# Patient Record
Sex: Male | Born: 1965 | Race: White | Hispanic: No | Marital: Married | Smoking: Never smoker
Health system: Southern US, Community
[De-identification: ages and names within clinical notes are randomized; demographics above are authoritative.]

## PROBLEM LIST (undated history)

## (undated) DIAGNOSIS — I10 Essential (primary) hypertension: Secondary | ICD-10-CM

## (undated) HISTORY — PX: MOUTH SURGERY: SHX715

---

## 2008-07-07 ENCOUNTER — Emergency Department (HOSPITAL_COMMUNITY): Admission: EM | Admit: 2008-07-07 | Discharge: 2008-07-07 | Payer: Self-pay | Admitting: Family Medicine

## 2011-07-19 ENCOUNTER — Other Ambulatory Visit: Payer: Self-pay | Admitting: Occupational Medicine

## 2011-07-19 ENCOUNTER — Ambulatory Visit
Admission: RE | Admit: 2011-07-19 | Discharge: 2011-07-19 | Disposition: A | Discharge status: Home / Self Care | Payer: Self-pay | Source: Ambulatory Visit | Attending: Occupational Medicine | Admitting: Occupational Medicine

## 2011-07-19 DIAGNOSIS — T1490XA Injury, unspecified, initial encounter: Secondary | ICD-10-CM

## 2011-09-17 ENCOUNTER — Other Ambulatory Visit: Payer: Self-pay | Admitting: Orthopedic Surgery

## 2011-09-17 ENCOUNTER — Ambulatory Visit
Admission: RE | Admit: 2011-09-17 | Discharge: 2011-09-17 | Disposition: A | Discharge status: Home / Self Care | Payer: PRIVATE HEALTH INSURANCE | Source: Ambulatory Visit | Attending: Orthopedic Surgery | Admitting: Orthopedic Surgery

## 2011-09-17 DIAGNOSIS — S6990XA Unspecified injury of unspecified wrist, hand and finger(s), initial encounter: Secondary | ICD-10-CM

## 2013-11-05 IMAGING — CR DG FINGER INDEX 2+V*L*
1 series · 1 of 1 positions shown · non-contrast
Comparison: None.

CLINICAL DATA: Left finger injury with pain.

LEFT INDEX FINGER 2+V

[view not recorded]
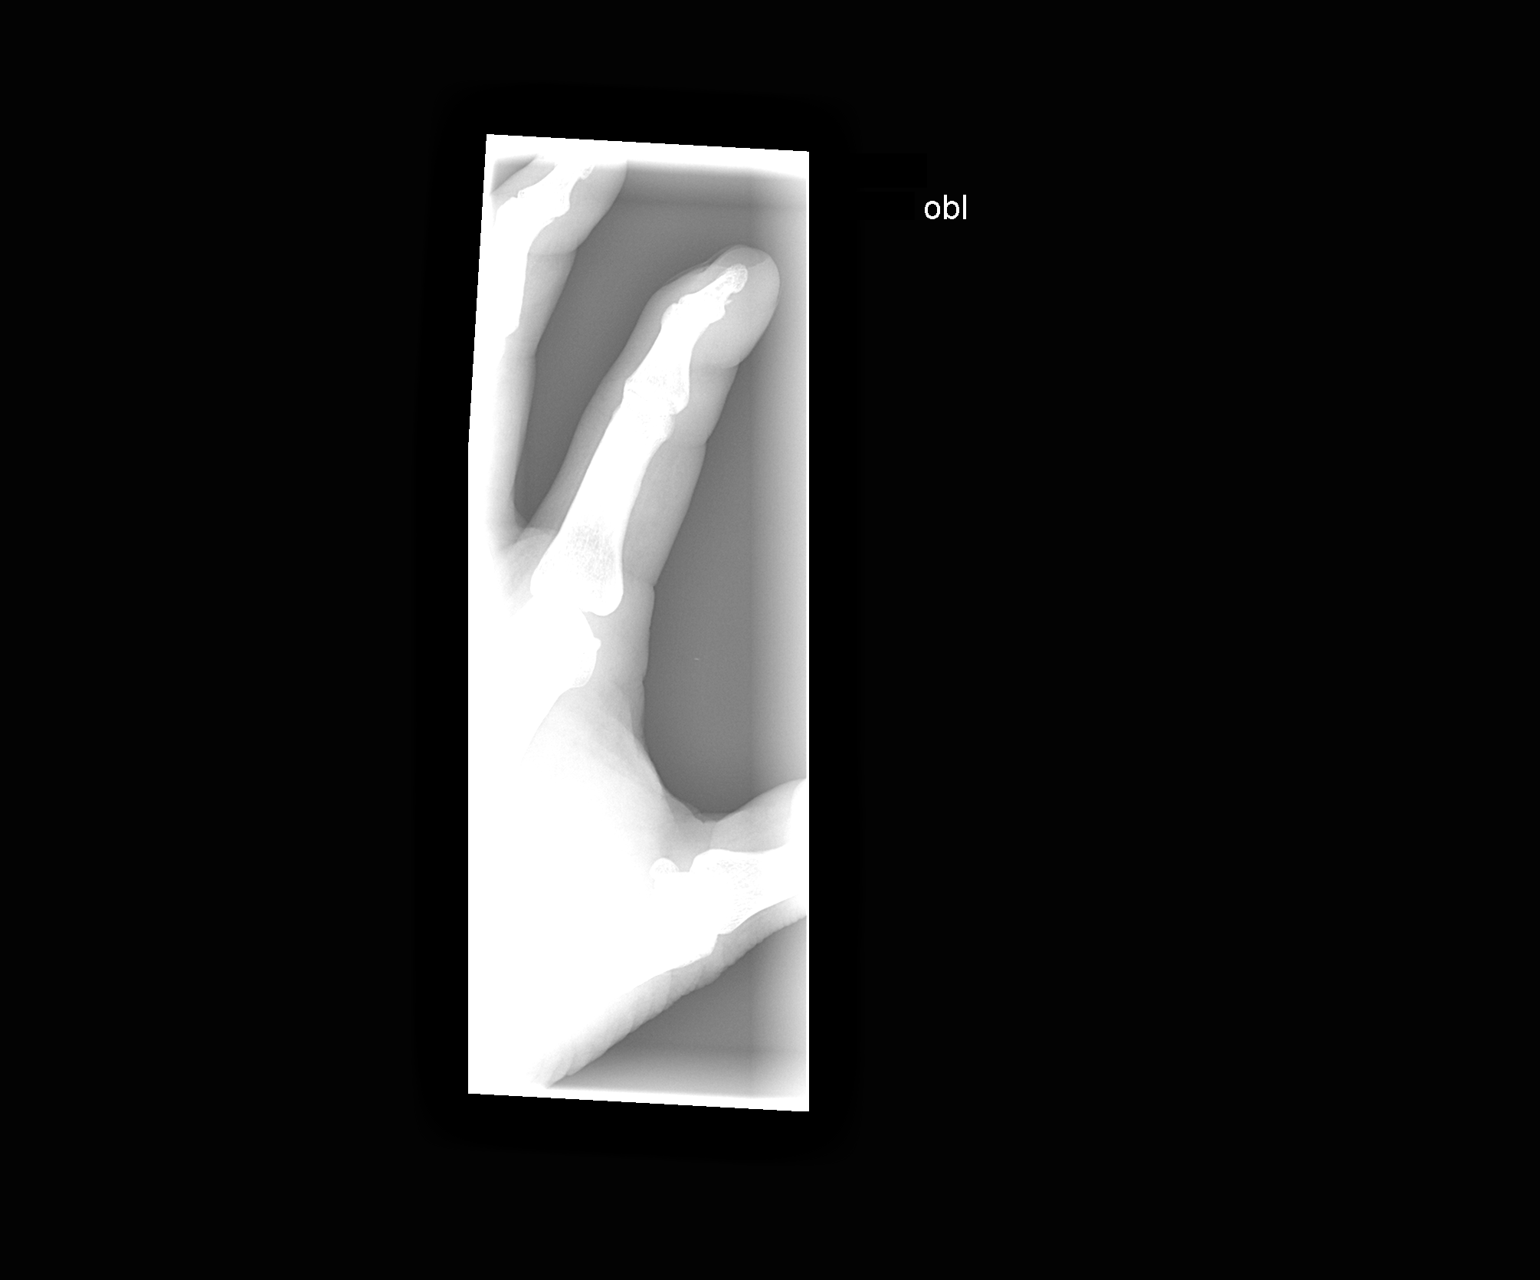

[1 of 1 positions shown; findings below may reference images not displayed]

FINDINGS: The distal interphalangeal joints appears held in slight
flexion.  There is a tiny osseous fragment along the radial aspect
of the distal phalangeal shaft.  Overlying soft tissue swelling.
No radiopaque foreign body.
IMPRESSION: 1.  Tiny avulsion fracture off the distal phalangeal shaft.
2.  Distal interphalangeal joint is held in slight flexion.

## 2014-01-04 IMAGING — CR DG FINGER INDEX 2+V*L*
1 series · 1 of 1 positions shown · non-contrast
Comparison: Left second finger films of 07/19/2011

CLINICAL DATA: Drill bit hit finger 2 months ago .

LEFT INDEX FINGER 2+V

[view not recorded]
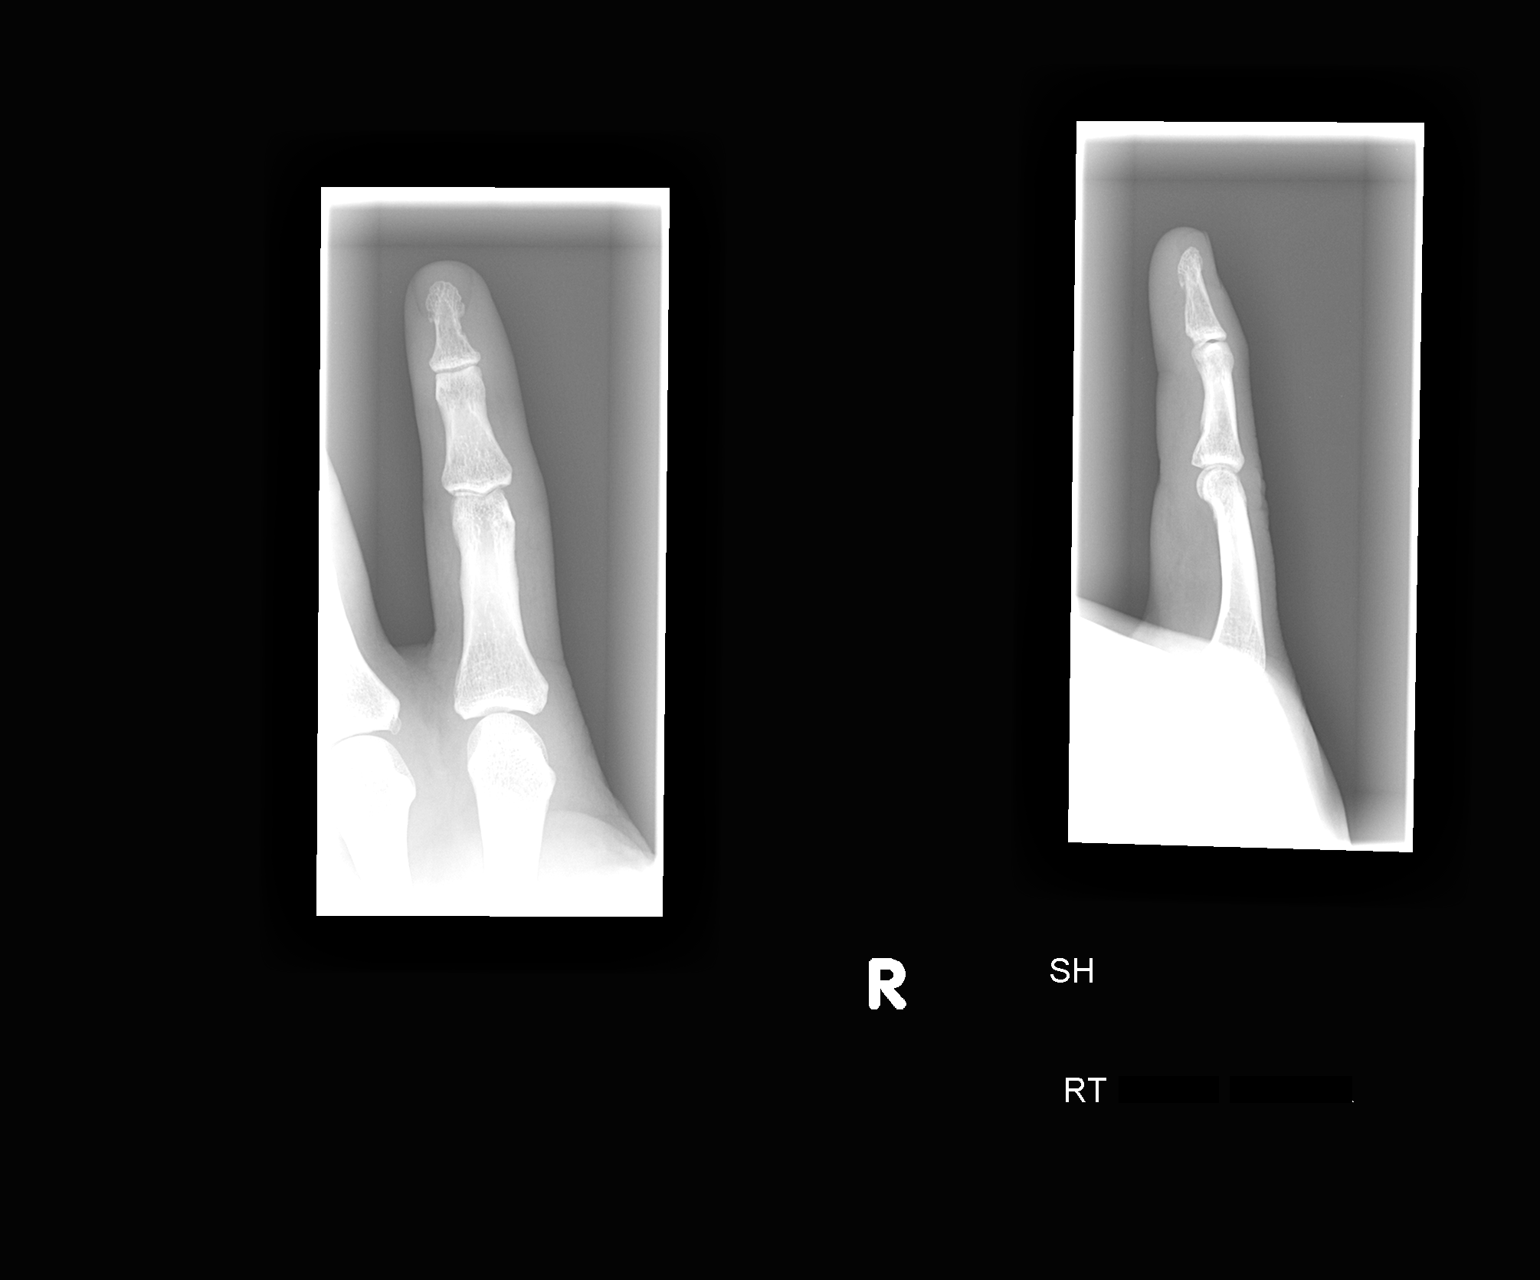

[1 of 1 positions shown; findings below may reference images not displayed]

FINDINGS: The small bony defect in the distal phalanx of the left
second digit appears to be partially healed.  No other abnormality
is seen
IMPRESSION: Some interval healing of the small cortical defect within the
distal phalanx.

## 2019-03-20 ENCOUNTER — Other Ambulatory Visit: Payer: Self-pay

## 2019-09-02 ENCOUNTER — Ambulatory Visit: Payer: Self-pay | Attending: Internal Medicine

## 2019-09-02 DIAGNOSIS — Z23 Encounter for immunization: Secondary | ICD-10-CM | POA: Insufficient documentation

## 2019-09-02 NOTE — Progress Notes (Signed)
   Covid-19 Vaccination Clinic  Name:  Brandon Flynn    MRN: [id number] DOB: 1965-10-13  09/02/2019  [name] was observed post Covid-19 immunization for 15 minutes without incidence. He was provided with Vaccine Information Sheet and instruction to access the V-Safe system.   [name] was instructed to call 911 with any severe reactions post vaccine: Marland Kitchen Difficulty breathing  . Swelling of your face and throat  . A fast heartbeat  . A bad rash all over your body  . Dizziness and weakness    Immunizations Administered    Name Date Dose VIS Date Route   Pfizer COVID-19 Vaccine 09/02/2019 10:53 AM 0.3 mL 06/15/2019 Intramuscular   Manufacturer: [employer], [employer]   Lot: [id number]   NDC: [address]-5

## 2019-09-25 ENCOUNTER — Ambulatory Visit: Payer: Self-pay | Attending: Internal Medicine

## 2019-09-25 DIAGNOSIS — Z23 Encounter for immunization: Secondary | ICD-10-CM

## 2019-09-25 NOTE — Progress Notes (Signed)
   Covid-19 Vaccination Clinic  Name:  Brandon Flynn    MRN: [id number] DOB: May 08, 1966  09/25/2019  Brandon Flynn was observed post Covid-19 immunization for 15 minutes without incident. He was provided with Vaccine Information Sheet and instruction to access the V-Safe system.   Brandon Flynn was instructed to call 911 with any severe reactions post vaccine: Marland Kitchen Difficulty breathing  . Swelling of face and throat  . A fast heartbeat  . A bad rash all over body  . Dizziness and weakness   Immunizations Administered    Name Date Dose VIS Date Route   Pfizer COVID-19 Vaccine 09/25/2019  3:32 PM 0.3 mL 06/15/2019 Intramuscular   Manufacturer: [employer], [employer]   Lot: [id number]   NDC: [address]-3

## 2024-05-17 ENCOUNTER — Encounter (HOSPITAL_COMMUNITY): Payer: Self-pay | Admitting: Orthopedic Surgery

## 2024-05-17 ENCOUNTER — Encounter: Payer: Self-pay | Admitting: Orthopedic Surgery

## 2024-05-17 ENCOUNTER — Ambulatory Visit (INDEPENDENT_AMBULATORY_CARE_PROVIDER_SITE_OTHER): Admitting: Orthopedic Surgery

## 2024-05-17 ENCOUNTER — Other Ambulatory Visit: Payer: Self-pay

## 2024-05-17 DIAGNOSIS — L97312 Non-pressure chronic ulcer of right ankle with fat layer exposed: Secondary | ICD-10-CM

## 2024-05-17 NOTE — Progress Notes (Signed)
 PCP - Dr. Stevan Ivanoff Cardiologist - denies  PPM/ICD - denies   Chest x-ray - denies EKG - denies Stress Test - denies ECHO - denies Cardiac Cath - denies  CPAP - denies  DM- denies  ASA/Blood Thinner Instructions: n/a   ERAS Protcol - clears until 0920  COVID TEST- n/a  Anesthesia review: no  Patient verbally denies any shortness of breath, fever, cough and chest pain during phone call      Questions were answered. Patient verbalized understanding of instructions.

## 2024-05-17 NOTE — H&P (Signed)
 Brandon Flynn is an 58 y.o. male.   Chief Complaint: right ankle wound HPI:  Brandon Flynn is a 58 year old male who presents with a recurrent right ankle ulcer following a remote tick bite.   He has a recurrent ulcer on his right ankle, which developed at the site of a previous tick bite. The tick was attached for about an hour. Despite treatment with mupirocin and doxycycline, the ulcer has not improved and continues to grow.   The ulcer initially appeared as a small, pea-sized scab that would fall off, leaving a crater that increased in size with each recurrence.   He recalls that the ulcer's condition worsened after exposure to unusually [place] at the beach, which he suspects may have led to contamination of the wound. Prior to this, the wound was similar to other insect bites he had experienced, which healed without issue.   He is concerned about the ulcer's proximity to the Achilles tendon and reports feeling some irritation in the area. He has a history of other insect bites that healed normally, except for this particular ulcer.     No past medical history on file.  No past surgical history on file.  No family history on file. Social History:  has no history on file for tobacco use, alcohol use, and drug use.  Allergies: Not on File  No medications prior to admission.    No results found for this or any previous visit (from the past 48 hours). No results found.  Review of Systems  All other systems reviewed and are negative.   There were no vitals taken for this visit. Physical Exam  Patient is alert and  oriented,  no adenopathy,  well-dressed,  normal affect,  normal respiratory effort.  CARDIOVASCULAR: Dorsalis pedis and posterior tibial pulses strong. EXTREMITIES: Necrotic ulcer over fat pad adjacent to Achilles, 1.5 cm diameter, with 3 cm diameter cellulitis.    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 Previous treatments ineffective. Surgical intervention required. - Scheduled surgical excision of necrotic tissue and placement of keratosis tissue graft. - Applied negative pressure wound therapy with wound vac sponge for one week. - Sent excised tissue for culture to identify pathogens. - Planned surgery for tomorrow.  Brandon Deland Collet, PA-C 05/17/2024, 4:15 PM

## 2024-05-17 NOTE — Progress Notes (Signed)
 Office Visit Note   Patient: Brandon Flynn           Date of Birth: [birth year]           MRN: [id number] Visit Date: 05/17/2024              Requested by: No referring provider defined for this encounter. PCP: Pcp, No  Chief Complaint  Patient presents with   Right Ankle - Wound Check      HPI: Discussed the use of AI scribe software for clinical note transcription with the patient, who gave verbal consent to proceed.  History of Present Illness Brandon Flynn is a 58 year old male who presents with a recurrent right ankle ulcer following a remote tick bite.  He has a recurrent ulcer on his right ankle, which developed at the site of a previous tick bite. The tick was attached for about an hour. Despite treatment with mupirocin and doxycycline, the ulcer has not improved and continues to grow.  The ulcer initially appeared as a small, pea-sized scab that would fall off, leaving a crater that increased in size with each recurrence.  He recalls that the ulcer's condition worsened after exposure to unusually warm water at the beach, which he suspects may have led to contamination of the wound. Prior to this, the wound was similar to other insect bites he had experienced, which healed without issue.  He is concerned about the ulcer's proximity to the Achilles tendon and reports feeling some irritation in the area. He has a history of other insect bites that healed normally, except for this particular ulcer.     Assessment & Plan: Visit Diagnoses:  1. Skin ulcer of right ankle with fat layer exposed (HCC)     Plan: Assessment and Plan Assessment & Plan Right ankle necrotic ulcer with cellulitis Chronic necrotic ulcer with cellulitis, poor circulation, and suspected Vibrio contamination. Previous treatments ineffective. Surgical intervention required. - Scheduled surgical excision of necrotic tissue and placement of keratosis tissue graft. - Applied negative  pressure wound therapy with wound vac sponge for one week. - Sent excised tissue for culture to identify pathogens. - Planned surgery for tomorrow.      Follow-Up Instructions: Return in about 1 week (around 05/24/2024).   Ortho Exam  Patient is alert, oriented, no adenopathy, well-dressed, normal affect, normal respiratory effort. Physical Exam CARDIOVASCULAR: Dorsalis pedis and posterior tibial pulses strong. EXTREMITIES: Necrotic ulcer over fat pad adjacent to Achilles, 1.5 cm diameter, with 3 cm diameter cellulitis.      Imaging: No results found.   Labs: No results found for: HGBA1C, ESRSEDRATE, CRP, LABURIC, REPTSTATUS, GRAMSTAIN, CULT, LABORGA   No results found for: ALBUMIN, PREALBUMIN, CBC  No results found for: MG No results found for: VD25OH  No results found for: PREALBUMIN     No data to display           There is no height or weight on file to calculate BMI.  Orders:  No orders of the defined types were placed in this encounter.  No orders of the defined types were placed in this encounter.    Procedures: No procedures performed  Clinical Data: No additional findings.  ROS:  All other systems negative, except as noted in the HPI. Review of Systems  Objective: Vital Signs: There were no vitals taken for this visit.  Specialty Comments:  No specialty comments available.  PMFS History: There are no active problems to display for this  patient.  History reviewed. No pertinent past medical history.  History reviewed. No pertinent family history.  History reviewed. No pertinent surgical history. Social History   Occupational History   Not on file  Tobacco Use   Smoking status: Not on file   Smokeless tobacco: Not on file  Substance and Sexual Activity   Alcohol use: Not on file   Drug use: Not on file   Sexual activity: Not on file

## 2024-05-17 NOTE — Progress Notes (Signed)
------------------------------------------  [facility] PROCEDURAL EXPEDITER NOTE-------------------------------------------------  Patient Name: Brandon Flynn Patient DOB: October 22, 1965 Today's Date: @TODAY @   Chart reviewed:  Yes  Documentation gaps: no Mediation record or history in chart Orders in place:  No  Communication with surgical team if no orders: sent IB message to Dr. EMERSON Sage fo orders Labs, test, and orders reviewed: none Requires surgical clearance:  No What type of clearance: n/a Clearance received: n/a Patient status:pre op   Barriers noted:n/a Intervention provided by [facility] team: orders Barrier resolved:  Yes   Ronal Bald, RN [employer] Expeditor

## 2024-05-17 NOTE — Pre-Procedure Instructions (Signed)
-------------    SDW INSTRUCTIONS given:  Your procedure is scheduled on 11/14.  Report to [facility] Main Entrance A at 09:50 A.M., and check in at the Admitting office.  Any questions or running late day of surgery: call [phone]    Remember:  Do not eat after midnight the night before your surgery  You may drink clear liquids until 09:20 AM the morning of your surgery.   Clear liquids allowed are: Water, Non-Citrus Juices (without pulp), Carbonated Beverages, Clear Tea, Black Coffee Only, and Gatorade    Take these medicines the morning of surgery with A SIP OF WATER  doxycycline    As of today, STOP taking any Aspirin (unless otherwise instructed by your surgeon) Aleve, Naproxen, Ibuprofen, Motrin, Advil, Goody's, BC's, all herbal medications, fish oil, and all vitamins.   Do NOT Smoke (Tobacco/Vaping) 24 hours prior to your procedure  If you use a CPAP at night, you may bring all equipment for your overnight stay.     You will be asked to remove any contacts, glasses, piercing's, hearing aid's, dentures/partials prior to surgery. Please bring cases for these items if needed.     Patients discharged the day of surgery will not be allowed to drive home, and someone needs to stay with them for 24 hours.  SURGICAL WAITING ROOM VISITATION Patients may have no more than 2 support people in the waiting area - these visitors may rotate.   Pre-op nurse will coordinate an appropriate time for 1 ADULT support person, who may not rotate, to accompany patient in pre-op.  Children under the age of 7 must have an adult with them who is not the patient and must remain in the main waiting area with an adult.  If the patient needs to stay at the hospital during part of their recovery, the visitor guidelines for inpatient rooms apply.  Please refer to the [facility] website for the visitor guidelines for any additional information.   Special instructions:   [place]- Preparing  For Surgery   Please follow these instructions carefully.   Shower the NIGHT BEFORE SURGERY and the MORNING OF SURGERY with DIAL Soap.   Pat yourself dry with a CLEAN TOWEL.  Wear CLEAN PAJAMAS to bed the night before surgery  Place CLEAN SHEETS on your bed the night of your first shower and DO NOT SLEEP WITH PETS.   Additional instructions for the day of surgery: DO NOT APPLY any lotions, deodorants, cologne, or perfumes.   Do not wear jewelry or makeup Do not wear nail polish, gel polish, artificial nails, or any other type of covering on natural nails (fingers and toes) Do not bring valuables to the hospital. [facility] is not responsible for valuables/personal belongings. Put on clean/comfortable clothes.  Please brush your teeth.  Ask your nurse before applying any prescription medications to the skin.

## 2024-05-18 ENCOUNTER — Encounter (HOSPITAL_COMMUNITY)
Admission: RE | Disposition: A | Discharge status: Home / Self Care | Payer: Self-pay | Source: Home / Self Care | Attending: Orthopedic Surgery

## 2024-05-18 ENCOUNTER — Ambulatory Visit (HOSPITAL_COMMUNITY)
Admission: RE | Admit: 2024-05-18 | Discharge: 2024-05-18 | Disposition: A | Discharge status: Home / Self Care | Attending: Orthopedic Surgery | Admitting: Orthopedic Surgery

## 2024-05-18 ENCOUNTER — Ambulatory Visit (HOSPITAL_COMMUNITY): Admitting: Anesthesiology

## 2024-05-18 ENCOUNTER — Encounter (HOSPITAL_COMMUNITY): Payer: Self-pay | Admitting: Orthopedic Surgery

## 2024-05-18 DIAGNOSIS — S91001A Unspecified open wound, right ankle, initial encounter: Secondary | ICD-10-CM | POA: Diagnosis not present

## 2024-05-18 DIAGNOSIS — L97319 Non-pressure chronic ulcer of right ankle with unspecified severity: Secondary | ICD-10-CM | POA: Insufficient documentation

## 2024-05-18 DIAGNOSIS — I10 Essential (primary) hypertension: Secondary | ICD-10-CM | POA: Insufficient documentation

## 2024-05-18 HISTORY — PX: INCISION AND DRAINAGE OF DEEP ABSCESS, CALF: SHX7361

## 2024-05-18 LAB — CBC WITH DIFFERENTIAL/PLATELET
Abs Immature Granulocytes: 0.04 K/uL (ref 0.00–0.07)
Basophils Absolute: 0 K/uL (ref 0.0–0.1)
Basophils Relative: 0 %
Eosinophils Absolute: 0 K/uL (ref 0.0–0.5)
Eosinophils Relative: 0 %
HCT: 46.5 % (ref 39.0–52.0)
Hemoglobin: 15 g/dL (ref 13.0–17.0)
Immature Granulocytes: 0 %
Lymphocytes Relative: 26 %
Lymphs Abs: 3.2 K/uL (ref 0.7–4.0)
MCH: 27.8 pg (ref 26.0–34.0)
MCHC: 32.3 g/dL (ref 30.0–36.0)
MCV: 86.1 fL (ref 80.0–100.0)
Monocytes Absolute: 0.7 K/uL (ref 0.1–1.0)
Monocytes Relative: 5 %
Neutro Abs: 8.1 K/uL — ABNORMAL HIGH (ref 1.7–7.7)
Neutrophils Relative %: 69 %
Platelets: 252 K/uL (ref 150–400)
RBC: 5.4 MIL/uL (ref 4.22–5.81)
RDW: 14.9 % (ref 11.5–15.5)
WBC: 12 K/uL — ABNORMAL HIGH (ref 4.0–10.5)
nRBC: 0 % (ref 0.0–0.2)

## 2024-05-18 LAB — NO BLOOD PRODUCTS

## 2024-05-18 SURGERY — INCISION AND DRAINAGE OF DEEP ABSCESS, CALF
Anesthesia: Regional | Laterality: Right

## 2024-05-18 MED ORDER — ONDANSETRON HCL 4 MG/2ML IJ SOLN
4.0000 mg | Freq: Once | INTRAMUSCULAR | Status: AC
  Administered 2024-05-18: 4 mg via INTRAVENOUS

## 2024-05-18 MED ORDER — HYDROMORPHONE HCL 1 MG/ML IJ SOLN: 0.2500 mg | INTRAMUSCULAR | Status: DC | PRN

## 2024-05-18 MED ORDER — HYDRALAZINE HCL 20 MG/ML IJ SOLN
INTRAMUSCULAR | Status: DC | PRN
  Administered 2024-05-18 (×2): 10 mg via INTRAVENOUS

## 2024-05-18 MED ORDER — VASHE WOUND IRRIGATION OPTIME
TOPICAL | Status: DC | PRN
  Administered 2024-05-18: 34 [oz_av]

## 2024-05-18 MED ORDER — MIDAZOLAM HCL 2 MG/2ML IJ SOLN
INTRAMUSCULAR | Status: AC
  Filled 2024-05-18: qty 2

## 2024-05-18 MED ORDER — CEFAZOLIN SODIUM-DEXTROSE 2-4 GM/100ML-% IV SOLN
2.0000 g | INTRAVENOUS | Status: AC
  Administered 2024-05-18: 2 g via INTRAVENOUS
  Filled 2024-05-18: qty 100

## 2024-05-18 MED ORDER — LIDOCAINE HCL (PF) 1 % IJ SOLN
INTRAMUSCULAR | Status: AC
  Filled 2024-05-18: qty 30

## 2024-05-18 MED ORDER — LABETALOL HCL 5 MG/ML IV SOLN
5.0000 mg | INTRAVENOUS | Status: DC | PRN
  Administered 2024-05-18: 5 mg via INTRAVENOUS
  Filled 2024-05-18: qty 4

## 2024-05-18 MED ORDER — FENTANYL CITRATE (PF) 100 MCG/2ML IJ SOLN
INTRAMUSCULAR | Status: AC
  Filled 2024-05-18: qty 2

## 2024-05-18 MED ORDER — LACTATED RINGERS IV SOLN: INTRAVENOUS | Status: DC

## 2024-05-18 MED ORDER — ORAL CARE MOUTH RINSE: 15.0000 mL | Freq: Once | OROMUCOSAL | Status: AC

## 2024-05-18 MED ORDER — CHLORHEXIDINE GLUCONATE 0.12 % MT SOLN
15.0000 mL | Freq: Once | OROMUCOSAL | Status: AC
  Administered 2024-05-18: 15 mL via OROMUCOSAL
  Filled 2024-05-18: qty 15

## 2024-05-18 MED ORDER — ROPIVACAINE HCL 5 MG/ML IJ SOLN
INTRAMUSCULAR | Status: DC | PRN
  Administered 2024-05-18: 30 mL via PERINEURAL

## 2024-05-18 MED ORDER — HYDRALAZINE HCL 20 MG/ML IJ SOLN
5.0000 mg | INTRAMUSCULAR | Status: AC
  Administered 2024-05-18 (×2): 5 mg via INTRAVENOUS
  Filled 2024-05-18: qty 1

## 2024-05-18 MED ORDER — ACETAMINOPHEN 500 MG PO TABS
1000.0000 mg | ORAL_TABLET | Freq: Once | ORAL | Status: AC
  Administered 2024-05-18: 1000 mg via ORAL
  Filled 2024-05-18: qty 2

## 2024-05-18 MED ORDER — HYDROCODONE-ACETAMINOPHEN 5-325 MG PO TABS: 1.0000 | ORAL_TABLET | ORAL | 0 refills | Status: DC | PRN

## 2024-05-18 MED ORDER — ONDANSETRON HCL 4 MG/2ML IJ SOLN
INTRAMUSCULAR | Status: AC
  Filled 2024-05-18: qty 2

## 2024-05-18 MED ORDER — HYDRALAZINE HCL 20 MG/ML IJ SOLN
5.0000 mg | Freq: Once | INTRAMUSCULAR | Status: AC
  Administered 2024-05-18: 5 mg via INTRAVENOUS

## 2024-05-18 SURGICAL SUPPLY — 40 items
BAG COUNTER SPONGE SURGICOUNT (BAG) IMPLANT
BLADE SURG 21 STRL SS (BLADE) ×1 IMPLANT
BNDG COHESIVE 4X5 TAN STRL LF (GAUZE/BANDAGES/DRESSINGS) IMPLANT
BNDG COHESIVE 6X5 TAN NS LF (GAUZE/BANDAGES/DRESSINGS) IMPLANT
BNDG COHESIVE 6X5 TAN ST LF (GAUZE/BANDAGES/DRESSINGS) IMPLANT
BNDG GAUZE DERMACEA FLUFF 4 (GAUZE/BANDAGES/DRESSINGS) IMPLANT
CANISTER WOUNDNEG PRESSURE 500 (CANNISTER) IMPLANT
CLEANSER WND VASHE 34 (WOUND CARE) IMPLANT
CLEANSER WND VASHE INSTL 34OZ (WOUND CARE) IMPLANT
COVER SURGICAL LIGHT HANDLE (MISCELLANEOUS) ×2 IMPLANT
DRAPE DERMATAC (DRAPES) IMPLANT
DRAPE U-SHAPE 47X51 STRL (DRAPES) ×1 IMPLANT
DRESSING PREVENA PLUS CUSTOM (GAUZE/BANDAGES/DRESSINGS) IMPLANT
DRESSING VERAFLO CLEANS CC MED (GAUZE/BANDAGES/DRESSINGS) IMPLANT
DRSG ADAPTIC 3X8 NADH LF (GAUZE/BANDAGES/DRESSINGS) ×1 IMPLANT
DRSG VAC PEEL AND PLACE LRG (GAUZE/BANDAGES/DRESSINGS) IMPLANT
DURAPREP 26ML APPLICATOR (WOUND CARE) ×1 IMPLANT
ELECTRODE REM PT RTRN 9FT ADLT (ELECTROSURGICAL) IMPLANT
GAUZE PAD ABD 8X10 STRL (GAUZE/BANDAGES/DRESSINGS) IMPLANT
GAUZE SPONGE 4X4 12PLY STRL (GAUZE/BANDAGES/DRESSINGS) IMPLANT
GLOVE BIOGEL PI IND STRL 9 (GLOVE) ×1 IMPLANT
GLOVE SURG ORTHO 9.0 STRL STRW (GLOVE) ×1 IMPLANT
GOWN STRL REUS W/ TWL XL LVL3 (GOWN DISPOSABLE) ×2 IMPLANT
GRAFT SKIN WND MARIGEN OMEGA3 (Tissue) IMPLANT
KIT BASIN OR (CUSTOM PROCEDURE TRAY) ×1 IMPLANT
KIT DRSG PREVENA PLUS 7DAY 125 (MISCELLANEOUS) IMPLANT
KIT TURNOVER KIT B (KITS) ×1 IMPLANT
MANIFOLD NEPTUNE II (INSTRUMENTS) ×1 IMPLANT
PACK ORTHO EXTREMITY (CUSTOM PROCEDURE TRAY) ×1 IMPLANT
PAD ARMBOARD POSITIONER FOAM (MISCELLANEOUS) ×2 IMPLANT
PAD NEG PRESSURE SENSATRAC (MISCELLANEOUS) IMPLANT
SET HNDPC FAN SPRY TIP SCT (DISPOSABLE) IMPLANT
SOLN 0.9% NACL POUR BTL 1000ML (IV SOLUTION) ×1 IMPLANT
STOCKINETTE IMPERVIOUS 9X36 MD (GAUZE/BANDAGES/DRESSINGS) IMPLANT
SUT ETHILON 2 0 PSLX (SUTURE) ×1 IMPLANT
SWAB COLLECTION DEVICE MRSA (MISCELLANEOUS) ×1 IMPLANT
SWAB CULTURE ESWAB REG 1ML (MISCELLANEOUS) IMPLANT
TOWEL GREEN STERILE (TOWEL DISPOSABLE) ×1 IMPLANT
TUBE CONNECTING 12X1/4 (SUCTIONS) ×1 IMPLANT
YANKAUER SUCT BULB TIP NO VENT (SUCTIONS) ×1 IMPLANT

## 2024-05-18 NOTE — Anesthesia Preprocedure Evaluation (Addendum)
 Anesthesia Evaluation  Patient identified by MRN, date of birth, ID band Patient awake    Reviewed: Allergy & Precautions, H&P , NPO status , Patient's Chart, lab work & pertinent test results  History of Anesthesia Complications Negative for: history of anesthetic complications  Airway Mallampati: III  TM Distance: >3 FB Neck ROM: Full    Dental no notable dental hx. (+) Dental Advisory Given   Pulmonary neg pulmonary ROS   Pulmonary exam normal breath sounds clear to auscultation       Cardiovascular hypertension (poorly controlled, not on medications),  Rhythm:Regular Rate:Normal     Neuro/Psych negative neurological ROS  negative psych ROS   GI/Hepatic negative GI ROS, Neg liver ROS,,,  Endo/Other  negative endocrine ROS    Renal/GU negative Renal ROS  negative genitourinary   Musculoskeletal   Abdominal   Peds  Hematology negative hematology ROS (+)   Anesthesia Other Findings   Reproductive/Obstetrics negative OB ROS                              Anesthesia Physical Anesthesia Plan  ASA: 3  Anesthesia Plan: Regional   Post-op Pain Management: Tylenol PO (pre-op)*   Induction: Intravenous  PONV Risk Score and Plan: 1  Airway Management Planned: Natural Airway and Nasal Cannula  Additional Equipment:   Intra-op Plan:   Post-operative Plan:   Informed Consent: I have reviewed the patients History and Physical, chart, labs and discussed the procedure including the risks, benefits and alternatives for the proposed anesthesia with the patient or authorized representative who has indicated his/her understanding and acceptance.     Dental advisory given  Plan Discussed with: CRNA and Anesthesiologist  Anesthesia Plan Comments: (Patient with initial BP of 245/137. Patient does not take medications at home. He denied headache, chest pain, or blurry vision. Initial  treatment in preop with 5 mg of labetalol with no change in BP. Patient then treated with a total of 15 mg of hydralazine with an improvement in his BP to 164/97. Patient still asymptomatic. Given necrotic nature of his ulcer, plan to proceed with procedure under regional anesthesia only. Discussed with patient that the medication effects are only temporary and that he needs to follow up with his PCP for management of his BP. The patient stated that was the plan. Discussed with the patient the risks of adverse events from elevated BP at this level including stroke, heart attack, and death. I informed the patient that if he were to develop headache, chest pain, or blurry vision that he needed to present to the ED right away. The patient expressed understanding.)         Anesthesia Quick Evaluation

## 2024-05-18 NOTE — Op Note (Signed)
 05/18/2024  3:53 PM  PATIENT:  Belvie A Hoffmann    PRE-OPERATIVE DIAGNOSIS:  Necrotic Ulcer Right Ankle  POST-OPERATIVE DIAGNOSIS:  Same  PROCEDURE: Excisional debridement necrotic ulcer of lateral right ankle. Tissue sent for cultures. Application Kerecis micro graft 19 cm. Local tissue transfer for wound closure 10 x 3 cm. Application Kerecis peel in place wound VAC sponge.  SURGEON:  Jerona LULLA Sage, MD  PHYSICIAN ASSISTANT:None ANESTHESIA:   General  PREOPERATIVE INDICATIONS:  EUSTACE HUR is a  58 y.o. male with a diagnosis of Necrotic Ulcer Right Ankle who failed conservative measures and elected for surgical management.    The risks benefits and alternatives were discussed with the patient preoperatively including but not limited to the risks of infection, bleeding, nerve injury, cardiopulmonary complications, the need for revision surgery, among others, and the patient was willing to proceed.  OPERATIVE IMPLANTS:   Implant Name Type Inv. Item Serial No. Manufacturer Lot No. LRB No. Used Action  GRAFT SKIN WND MARIGEN OMEGA3 - [id number] Tissue GRAFT SKIN WND MARIGEN OMEGA3  [id number] Right 1 Implanted    @ENCIMAGES @  OPERATIVE FINDINGS: Necrotic wound was sent for cultures.  Tissue margins were clear with healthy viable tissue.  OPERATIVE PROCEDURE: Patient was brought to the operating room after undergoing a regional anesthetic.  After adequate levels anesthesia were obtained patient's right lower extremity was prepped using DuraPrep draped into a sterile field a timeout was called.  Elliptical incision was made around the ulcerative tissue and the ulcer and tissue were resected in 1 block of tissue.  This was sent for cultures.  After excision the wound was 10 x 3 cm.  Tissue margins were undermined to allow for local tissue transfer.  The wound bed was filled with 19 cm Kerecis micro graft for soft tissue reinforcement.  The incision was closed  with local tissue transfer 10 x 3 cm.  This was covered with a Prevena peel in place wound VAC sponge this had a good suction fit patient was taken the PACU in stable condition.   DISCHARGE PLANNING:  Antibiotic duration: Preoperative antibiotics  Weightbearing: Weightbearing as tolerated in a cam boot  Pain medication: Prescription for Vicodin  Dressing care/ Wound VAC: Follow-up in 1 week to change the wound VAC dressing  Ambulatory devices: Crutches  Discharge to: Home.  Follow-up: In the office 1 week post operative.

## 2024-05-18 NOTE — Progress Notes (Signed)
 Orthopedic Tech Progress Note Patient Details:  Brandon Flynn 06-15-1966 [id number]  Ortho Devices Type of Ortho Device: Crutches, CAM walker Ortho Device/Splint Location: RLE Ortho Device/Splint Interventions: Ordered, Application   Post Interventions Patient Tolerated: Well  Emmanuelle Hibbitts A Shay Bartoli 05/18/2024, 4:22 PM

## 2024-05-18 NOTE — Anesthesia Procedure Notes (Signed)
 Anesthesia Regional Block: Popliteal block   Pre-Anesthetic Checklist: , timeout performed,  Correct Patient, Correct Site, Correct Laterality,  Correct Procedure, Correct Position, site marked,  Risks and benefits discussed,  Surgical consent,  Pre-op evaluation,  At surgeon's request and post-op pain management  Laterality: Right  Prep: chloraprep       Needles:  Injection technique: Single-shot  Needle Type: Echogenic Stimulator Needle     Needle Length: 9cm  Needle Gauge: 21     Additional Needles:   Procedures:,,,, ultrasound used (permanent image in chart),,    Narrative:  Start time: 05/18/2024 1:10 PM End time: 05/18/2024 1:15 PM Injection made incrementally with aspirations every 5 mL.  Performed by: Personally  Anesthesiologist: Peggye Delon Brunswick, MD  Additional Notes: Discussed risks and benefits of nerve block including, but not limited to, prolonged and/or permanent nerve injury involving sensory and/or motor function. Monitors were applied and a time-out was performed. The nerve and associated structures were visualized under ultrasound guidance. After negative aspiration, local anesthetic was slowly injected around the nerve. There was no evidence of high pressure during the procedure. There were no paresthesias. VSS remained stable and the patient tolerated the procedure well.

## 2024-05-18 NOTE — Discharge Instructions (Addendum)
 Weight bearing as tolerates with crutches for support wear cam boot like a cast.  Keep dressing clean and dry.  Elevate your foot to decrease swelling.  Follow up in 1 week.

## 2024-05-18 NOTE — Progress Notes (Addendum)
 Pt's BP 242/136 manually. Per pt it's stress related; it usually high when he drives. Dr. Epifanio is aware.

## 2024-05-18 NOTE — Interval H&P Note (Signed)
 History and Physical Interval Note:  05/18/2024 11:47 AM  Brandon Flynn  has presented today for surgery, with the diagnosis of Necrotic Ulcer Right Ankle.  The various methods of treatment have been discussed with the patient and family. After consideration of risks, benefits and other options for treatment, the patient has consented to  Procedure(s) with comments: INCISION AND DRAINAGE OF DEEP ABSCESS, CALF (Right) - RIGHT ANKLE DEBRIDEMENT AND TISSUE GRAFT as a surgical intervention.  The patient's history has been reviewed, patient examined, no change in status, stable for surgery.  I have reviewed the patient's chart and labs.  Questions were answered to the patient's satisfaction.     Brandon Flynn

## 2024-05-18 NOTE — Anesthesia Postprocedure Evaluation (Signed)
 Anesthesia Post Note  Patient: Brandon Flynn  Procedure(s) Performed: INCISION AND DRAINAGE OF DEEP ABSCESS, CALF (Right)     Patient location during evaluation: PACU Anesthesia Type: Regional Level of consciousness: awake Pain management: pain level controlled Vital Signs Assessment: post-procedure vital signs reviewed and stable Respiratory status: spontaneous breathing, nonlabored ventilation and respiratory function stable Cardiovascular status: stable and blood pressure returned to baseline Postop Assessment: no apparent nausea or vomiting Anesthetic complications: no Comments: Discussed with patient that while his BP is within normal range right now, it is most likely to increase again as the antihypertensives wear off. Discussed having patient go to the ED for further evaluation and treatment, but patient declined. He reported that he can measure his BP at home and will schedule an appointment with his PCP ASAP. I stressed the importance of having this treated as soon as possible due to risks of stroke, heart attack, or death. He expressed understanding and understands to report to the ED if he begins to have any chest pain, blurry vision, or headache in the setting of elevated blood pressure. He again declines ED visit at this time.   No notable events documented.  Last Vitals:  Vitals:   05/18/24 1448 05/18/24 1500  BP: 118/73 108/67  Pulse: 76 80  Resp: 16 13  Temp:    SpO2: 98% 96%    Last Pain:  Vitals:   05/18/24 1500  TempSrc:   PainSc: 0-No pain        RLE Motor Response: No movement due to regional block (05/18/24 1500) RLE Sensation: No sensation (absent) (05/18/24 1500)      Delon Aisha Arch

## 2024-05-18 NOTE — Progress Notes (Signed)
 Pt received total of 20 mg of Hydralazine and 5 mg of Labetalol 5 mg IV per MD order. Last checked BP 189/112.

## 2024-05-18 NOTE — Transfer of Care (Signed)
 Immediate Anesthesia Transfer of Care Note  Patient: Brandon Flynn  Procedure(s) Performed: INCISION AND DRAINAGE OF DEEP ABSCESS, CALF (Right)  Patient Location: PACU  Anesthesia Type:MAC  Level of Consciousness: awake, alert , and oriented  Airway & Oxygen Therapy: Patient Spontanous Breathing  Post-op Assessment: Report given to RN, Post -op Vital signs reviewed and stable, and Patient moving all extremities X 4  Post vital signs: Reviewed and stable  Last Vitals:  Vitals Value Taken Time  BP 91/71 05/18/24 14:45  Temp    Pulse 73 05/18/24 14:46  Resp 12 05/18/24 14:46  SpO2 98 % 05/18/24 14:46  Vitals shown include unfiled device data.  Last Pain:  Vitals:   05/18/24 1029  TempSrc:   PainSc: 0-No pain         Complications: No notable events documented.

## 2024-05-20 LAB — ACID FAST SMEAR (AFB, MYCOBACTERIA): Acid Fast Smear: NEGATIVE

## 2024-05-21 ENCOUNTER — Encounter (HOSPITAL_COMMUNITY): Payer: Self-pay | Admitting: Orthopedic Surgery

## 2024-05-23 LAB — AEROBIC/ANAEROBIC CULTURE W GRAM STAIN (SURGICAL/DEEP WOUND)
Culture: NO GROWTH
Gram Stain: NONE SEEN

## 2024-05-25 ENCOUNTER — Ambulatory Visit: Admitting: Family

## 2024-05-25 DIAGNOSIS — S91001A Unspecified open wound, right ankle, initial encounter: Secondary | ICD-10-CM

## 2024-05-26 ENCOUNTER — Encounter: Payer: Self-pay | Admitting: Family

## 2024-05-26 NOTE — Progress Notes (Signed)
   Post-Op Visit Note   Patient: Brandon Flynn           Date of Birth: 30-Sep-1965           MRN: [id number] Visit Date: 05/25/2024 PCP: Weyman Bright, MD  Chief Complaint:  Chief Complaint  Patient presents with   Right Leg - Routine Post Op    05/18/2024 I&D right calf     HPI:  HPI The patient is a 58 year old gentleman seen status post irrigation debridement of right calf 1 week ago.  Wound VAC discontinued today he is in the cam walker using crutches Ortho Exam On examination right calf incision well-approximated sutures there is one 1 cm in diameter serous blister just proximal to his incision without erythema warmth no current weeping  Visit Diagnoses: No diagnosis found.  Plan: Begin daily Dial soap cleansing.  Dry dressings.  Continue cam boot.  Follow-Up Instructions: No follow-ups on file.   Imaging: No results found.  Orders:  No orders of the defined types were placed in this encounter.  No orders of the defined types were placed in this encounter.    PMFS History: Patient Active Problem List   Diagnosis Date Noted   Ankle wound, right, initial encounter 05/18/2024   No past medical history on file.  No family history on file.  Past Surgical History:  Procedure Laterality Date   INCISION AND DRAINAGE OF DEEP ABSCESS, CALF Right 05/18/2024   Procedure: INCISION AND DRAINAGE OF DEEP ABSCESS, CALF;  Surgeon: Harden Jerona GAILS, MD;  Location: [facility] OR;  Service: Orthopedics;  Laterality: Right;  RIGHT ANKLE DEBRIDEMENT AND TISSUE GRAFT   MOUTH SURGERY     Social History   Occupational History   Not on file  Tobacco Use   Smoking status: Never   Smokeless tobacco: Never  Vaping Use   Vaping status: Never Used  Substance and Sexual Activity   Alcohol use: Yes    Alcohol/week: 5.0 standard drinks of alcohol    Types: 5 Standard drinks or equivalent per week   Drug use: Not Currently   Sexual activity: Not on file

## 2024-05-28 ENCOUNTER — Ambulatory Visit: Payer: Self-pay

## 2024-05-28 ENCOUNTER — Other Ambulatory Visit: Payer: Self-pay

## 2024-05-28 MED ORDER — CIPROFLOXACIN HCL 500 MG PO TABS: 500.0000 mg | ORAL_TABLET | Freq: Two times a day (BID) | ORAL | 0 refills | Status: DC

## 2024-05-28 NOTE — Telephone Encounter (Signed)
 I called and sw pt to advise of results and of abx that has been called in. Voiced understanding and will call with any questions or concerns.

## 2024-05-28 NOTE — Telephone Encounter (Signed)
-----   Message from Jerona LULLA Sage sent at 05/28/2024  9:55 AM EST ----- Call in cipro  500mg  bid for 10 days ----- Message ----- From: Rebecka, Lab [facility] [place] Sent: 05/19/2024   1:16 PM EST To: Jerona Sage LULLA, MD

## 2024-06-06 ENCOUNTER — Ambulatory Visit: Admitting: Family

## 2024-06-06 ENCOUNTER — Telehealth: Payer: Self-pay | Admitting: Family

## 2024-06-06 DIAGNOSIS — S91001A Unspecified open wound, right ankle, initial encounter: Secondary | ICD-10-CM

## 2024-06-06 NOTE — Telephone Encounter (Signed)
 Pt called saying that he received a call from us , nut no message was left. I looked in his chart, and it doesn't look like he received a call from us , but he insisted he received one Call back number is [phone]

## 2024-06-06 NOTE — Telephone Encounter (Signed)
 Did you try and call this pt for any reason? You saw him in the office today.

## 2024-06-07 ENCOUNTER — Encounter: Payer: Self-pay | Admitting: Family

## 2024-06-07 NOTE — Telephone Encounter (Signed)
 Yes, I called him yesterday to tell him I had talked to Jagual and we are not going to place on new abx this week, Harden plans to see him at next visit

## 2024-06-07 NOTE — Progress Notes (Signed)
   Post-Op Visit Note   Patient: Brandon Flynn           Date of Birth: 28-Mar-1966           MRN: [id number] Visit Date: 06/06/2024 PCP: Weyman Bright, MD  Chief Complaint:  Chief Complaint  Patient presents with   Right Leg - Routine Post Op    05/18/2024 I&D right calf     HPI:  HPI The patient is a 58 year old gentleman who is seen today for evaluation of his incision he is status post irrigation debridement of the right lower extremity November 14.  Has been doing daily Dial soap cleansing and dry dressings using a cam boot for ambulation  He continues on ciprofloxacin  Ortho Exam On examination right posterior calf incision this is well-approximated sutures however there are ischemic changes along the entire length of the incision with scant serosanguineous drainage there is some mild surrounding erythema without ascending cellulitis Visit Diagnoses: No diagnosis found.  Plan: Concern for poor healing of this incision discussed case with Dr. Harden he would like to follow-up in the office in 6 days he will continue on his ciprofloxacin  and begin Vashe to dry dressings.  Follow-Up Instructions: No follow-ups on file.   Imaging: No results found.  Orders:  No orders of the defined types were placed in this encounter.  No orders of the defined types were placed in this encounter.    PMFS History: Patient Active Problem List   Diagnosis Date Noted   Ankle wound, right, initial encounter 05/18/2024   No past medical history on file.  No family history on file.  Past Surgical History:  Procedure Laterality Date   INCISION AND DRAINAGE OF DEEP ABSCESS, CALF Right 05/18/2024   Procedure: INCISION AND DRAINAGE OF DEEP ABSCESS, CALF;  Surgeon: Harden Jerona GAILS, MD;  Location: [facility];  Service: Orthopedics;  Laterality: Right;  RIGHT ANKLE DEBRIDEMENT AND TISSUE GRAFT   MOUTH SURGERY     Social History   Occupational History   Not on file  Tobacco Use   Smoking  status: Never   Smokeless tobacco: Never  Vaping Use   Vaping status: Never Used  Substance and Sexual Activity   Alcohol use: Yes    Alcohol/week: 5.0 standard drinks of alcohol    Types: 5 Standard drinks or equivalent per week   Drug use: Not Currently   Sexual activity: Not on file

## 2024-06-11 ENCOUNTER — Ambulatory Visit: Admitting: Physician Assistant

## 2024-06-11 ENCOUNTER — Encounter: Payer: Self-pay | Admitting: Physician Assistant

## 2024-06-11 DIAGNOSIS — L97312 Non-pressure chronic ulcer of right ankle with fat layer exposed: Secondary | ICD-10-CM

## 2024-06-11 NOTE — Progress Notes (Signed)
 Office Visit Note   Patient: Brandon Flynn           Date of Birth: January 20, 1966           MRN: [id number] Visit Date: 06/11/2024              Requested by: Weyman Bright, MD [address] [place],  [place] [address] PCP: Weyman Bright, MD  Chief Complaint  Patient presents with   Right Leg - Routine Post Op    05/18/2024 I&D right calf       HPI: 58 y/o male with history of necrotic ulcer on the right ankle.  S/P Irrigation and debridement on 05/18/24.  On his last visit the skin edges had ischemic changes.  The plan was to clean the incisional area with Vashe and he was prescribed Ciprofloxacin .    PMhx:  He presented with a history of tick bite on the right lateral ankle.  He then had an open wound and went into the ocean.  He developed a recurrent scab that would fall off and the reoccur.  The ulcer initially appeared as a small, pea-sized scab that would fall off, leaving a crater that increased in size with each recurrence.     He recalls that the ulcer's condition worsened after exposure to unusually warm water at the beach, which he suspects may have led to contamination of the wound. Prior to this, the wound was similar to other insect bites he had experienced, which healed without issue.  Right ankle necrotic ulcer with cellulitis.  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    Assessment & Plan: Visit Diagnoses:  1. Skin ulcer of right ankle with fat layer exposed (HCC)     Plan: Non healing right lateral ankle incisional wound with black eschar.  Repeat irrigation and debridement of the right ankle non healing wound.  We reviewed the culture from his previous surgery which grew RARE STAPHYLOCOCCUS EPIDERMIDIS and RARE DIPHTHEROIDS(CORYNEBACTERIUM SPECIES Based on these last cultures Dr. Fleeta Rothman suggested zyvox 600mg  bid x 2 weeks and re-assess. Cipro  ot useful vs staph and this one is tetra R. it is sulfa S but would  use zyvox for post op.  He also suggested we repeat an Afb and fungal.  We will plan consulting ID post op as well.    Follow-Up Instructions: Return following surgery.   Ortho Exam  Patient is alert, oriented, no adenopathy, well-dressed, normal affect, normal respiratory effort. The incision has ischemic changes.  Minimal local edema with surrounding erythema and tenderness to to palpation.  Black eschar surrounding the incision. He has a palpable DP pulse with multiphasic doppler signals in the DP/PT.     Imaging: No results found. No images are attached to the encounter.  Labs: Lab Results  Component Value Date   REPTSTATUS 05/23/2024 FINAL 05/18/2024   GRAMSTAIN  05/18/2024    NO WBC SEEN NO ORGANISMS SEEN Gram Stain Report Called to,Read Back By and Verified With: RN DR.DUDA [id number] @ 1600 FH    CULT  05/18/2024    RARE STAPHYLOCOCCUS EPIDERMIDIS RARE DIPHTHEROIDS(CORYNEBACTERIUM SPECIES) Standardized susceptibility testing for this organism is not available. NO ANAEROBES ISOLATED Performed at [facility], [address], [place], [place] [address]    LABORGA STAPHYLOCOCCUS EPIDERMIDIS 05/18/2024     No results found for: ALBUMIN, PREALBUMIN, CBC  No results found for: MG No results found for: VD25OH  No results found for: PREALBUMIN  Latest Ref Rng & Units 05/18/2024   10:58 AM  CBC EXTENDED  WBC 4.0 - 10.5 K/uL 12.0   RBC 4.22 - 5.81 MIL/uL 5.40   Hemoglobin 13.0 - 17.0 g/dL 84.9   HCT 60.9 - 47.9 % 46.5   Platelets 150 - 400 K/uL 252   NEUT# 1.7 - 7.7 K/uL 8.1   Lymph# 0.7 - 4.0 K/uL 3.2      There is no height or weight on file to calculate BMI.  Orders:  No orders of the defined types were placed in this encounter.  No orders of the defined types were placed in this encounter.    Procedures: No procedures performed  Clinical Data: No additional findings.  ROS:  All other systems negative, except as noted in the  HPI. Review of Systems  Objective: Vital Signs: There were no vitals taken for this visit.  Specialty Comments:  No specialty comments available.  PMFS History: Patient Active Problem List   Diagnosis Date Noted   Ankle wound, right, initial encounter 05/18/2024   History reviewed. No pertinent past medical history.  History reviewed. No pertinent family history.  Past Surgical History:  Procedure Laterality Date   INCISION AND DRAINAGE OF DEEP ABSCESS, CALF Right 05/18/2024   Procedure: INCISION AND DRAINAGE OF DEEP ABSCESS, CALF;  Surgeon: Harden Jerona GAILS, MD;  Location: [facility] OR;  Service: Orthopedics;  Laterality: Right;  RIGHT ANKLE DEBRIDEMENT AND TISSUE GRAFT   MOUTH SURGERY     Social History   Occupational History   Not on file  Tobacco Use   Smoking status: Never   Smokeless tobacco: Never  Vaping Use   Vaping status: Never Used  Substance and Sexual Activity   Alcohol use: Yes    Alcohol/week: 5.0 standard drinks of alcohol    Types: 5 Standard drinks or equivalent per week   Drug use: Not Currently   Sexual activity: Not on file

## 2024-06-11 NOTE — H&P (Signed)
 Brandon Flynn is an 58 y.o. male.   Chief Complaint: Right ankle non healing wound  HPI: 58 y/o male with history of necrotic ulcer on the right ankle.  S/P Irrigation and debridement on 05/18/24.  On his last visit the skin edges had ischemic changes.  The plan was to clean the incisional area with Vashe and he was prescribed Ciprofloxacin .               PMhx:  He presented with a history of tick bite on the right lateral ankle.  He then had an open wound and went into the ocean.  He developed a recurrent scab that would fall off and the reoccur.  The ulcer initially appeared as a small, pea-sized scab that would fall off, leaving a crater that increased in size with each recurrence.                He recalls that the ulcer's condition worsened after exposure to unusually warm water at the beach, which he suspects may have led to contamination of the wound. Prior to this, the wound was similar to other insect bites he had experienced, which healed without issue.  Right ankle necrotic ulcer with cellulitis.  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  No past medical history on file.  Past Surgical History:  Procedure Laterality Date   INCISION AND DRAINAGE OF DEEP ABSCESS, CALF Right 05/18/2024   Procedure: INCISION AND DRAINAGE OF DEEP ABSCESS, CALF;  Surgeon: Brandon Jerona GAILS, MD;  Location: [facility];  Service: Orthopedics;  Laterality: Right;  RIGHT ANKLE DEBRIDEMENT AND TISSUE GRAFT   MOUTH SURGERY      No family history on file. Social History:  reports that he has never smoked. He has never used smokeless tobacco. He reports current alcohol use of about 5.0 standard drinks of alcohol per week. He reports that he does not currently use drugs.  Allergies:  Allergies  Allergen Reactions   Sulfa Antibiotics Hives and Itching    No medications prior to admission.    No results found for this or any previous visit (from the past 48  hours). No results found.  Review of Systems  All other systems reviewed and are negative.   There were no vitals taken for this visit. Physical Exam  Patient is alert, oriented, no adenopathy, well-dressed, normal affect, normal respiratory effort. The incision has ischemic changes.  Minimal local edema with surrounding erythema and tenderness to to palpation.  Black eschar surrounding the incision. He has a palpable DP pulse with multiphasic doppler signals in the DP/PT.      Assessment/Plan Visit Diagnoses:  1. Skin ulcer of right ankle with fat layer exposed (HCC)       Plan: Non healing right lateral ankle incisional wound with black eschar.  Repeat irrigation and debridement of the right ankle non healing wound.  We reviewed the culture from his previous surgery which grew RARE STAPHYLOCOCCUS EPIDERMIDIS and RARE DIPHTHEROIDS(CORYNEBACTERIUM SPECIES   Based on these last cultures Dr. Fleeta Flynn suggested zyvox 600mg  bid x 2 weeks and re-assess. Cipro  ot useful vs staph and this one is tetra R. it is sulfa S but would use zyvox for post op.  He also suggested we repeat an Afb and fungal.  We will plan consulting ID post op as well.       Brandon Deland Collet, PA-C 06/11/2024, 5:06 PM

## 2024-06-12 ENCOUNTER — Other Ambulatory Visit: Payer: Self-pay

## 2024-06-12 ENCOUNTER — Encounter: Admitting: Orthopedic Surgery

## 2024-06-12 ENCOUNTER — Encounter (HOSPITAL_COMMUNITY): Payer: Self-pay | Admitting: Orthopedic Surgery

## 2024-06-12 NOTE — Progress Notes (Signed)
 SDW CALL  Patient was given pre-op instructions over the phone. The opportunity was given for the patient to ask questions. No further questions asked. Patient verbalized understanding of instructions given.   PCP - Dr. Stevan Ivanoff Cardiologist - denies  PPM/ICD - denies Device Orders - n/a Rep Notified -  n/a  Chest x-ray - denies EKG - DOS Stress Test -denies  ECHO - denies Cardiac Cath - denies  Sleep Study -  denies  NO DM  Last dose of GLP1 agonist-  n/a GLP1 instructions:  n/a  Blood Thinner Instructions: n/a Aspirin  Instructions: n/a  ERAS Protcol - clears until 1030 PRE-SURGERY Ensure or G2- n/a   COVID TEST- n/a   Anesthesia review: no  Patient denies shortness of breath, fever, cough and chest pain over the phone call   All instructions explained to the patient, with a verbal understanding of the material. Patient agrees to go over the instructions while at home for a better understanding.

## 2024-06-12 NOTE — Progress Notes (Signed)
 TeleHealth Video Encounter This medical encounter was conducted virtually using [email or web address]  [email or web address] protocols.  Patient ID: Brandon Flynn is a 58 y.o. male who presents by video interaction for  1. Chronic skin ulcer, unspecified ulcer stage    (CMS-HCC)   2. CKD stage 3a, GFR 45-59 ml/min (CMS-HCC)   3. Hypertension, unspecified type    .  Present on Video Call: patient   Assessment/Plan:     Advance care planning:   HCDM (patient stated preference): Brandon Flynn,Brandon Flynn - Spouse - [phone]     Assessment & Plan Chronic skin ulcer with infection, post-surgical graft   He has a persistent infection following a surgical graft, necessitating a second surgery for debridement. He is following with [facility].  An infectious disease consultation is planned. No antibiotics are currently administered to avoid affecting culture results.  - recommend ID constulation  - getting surgery tomorrow, recommend intra op culture s   Essential hypertension   His blood pressure has decreased but remains elevated. He is currently on amlodipine 5 mg daily and reports no symptoms. Valsartan will be added post-surgery to improve control, avoiding pre-surgery initiation due to risks of volume depletion and kidney injury.  - continue amlodipine 5 mg daily  - start valsartan 160 mg post surgery  - follow up 2 weeks after starting valsartan   Elevated creatinine/? Chronic kidney disease, stage 3   He has elevated creatinine and reduced eGFR, likely secondary to hypertension, with no family history of kidney disease.Unclear if this is a single reading or evidence of CKD. Suspect more likely CKD but will need additional labs. He does report significant symptoms to suggest obstruction from BPH but will check renal ultrasound  - renally dose all medications  - avoid nephrotoxic meds (NSAIDS, unneccessary scans with contrast, etc)  - urine protein/creatine ratio  - repeat BMP  - HTN  management as above       No follow-ups on file.      Subjective:   History of Present Illness Brandon Flynn is a 58 year old male with hypertension who presents for follow-up.  His home blood pressure has improved slightly but remains elevated, now about 160/95 to 174/95 with a 10-15 point drop from prior readings. He has no chest pain, headache, or shortness of breath. He takes amlodipine 5 mg daily without side effects.  He is awaiting a second surgery to debride an unresolved soft tissue infection in the ankle. He is off antibiotics to allow accurate intraoperative cultures, after having completed ciprofloxacin  when an earlier wound culture grew staph.  Recent labs showed impaired kidney function with creatinine 1.53 and eGFR 52. He reporpst an episode of pyelonephritis 27 years ago but no family history of kidney disease. He has no hematuria, fever, rash, joint pains, or heavy nephrotoxic medication use. He has nocturia that improves when he limits evening fluids, and a tapering urinary stream without incontinence.  He has elevated cholesterol with LDL 113 and a slightly elevated white blood cell count on prior labs. His glucose is 108 with A1c 5.6, consistent with no diabetes.   Current Providers  Patient Care Team: Esmeralda Ewings, Anya L, MD as PCP - General (Internal Medicine)     Objective:   As part of this Video Visit, no in-person exam was conducted. Video interaction permitted the following observations.  General: NAD RESP: Relaxed respiratory effort.  SKIN: No rashes noted. NEURO: Normal coordination.  No tremors observed. PSYCH: Alert and oriented.  Speech fluent  and sensible.  Calm affect.      The patient reports they are physically located in [place]  and is currently: at home. I conducted a audio/video visit. I spent  75m 34s on the video call with the patient. I spent an additional 12 minutes on pre- and post-visit activities on the date  of service .

## 2024-06-13 ENCOUNTER — Encounter (HOSPITAL_COMMUNITY): Admission: RE | Disposition: A | Payer: Self-pay | Source: Home / Self Care | Attending: Orthopedic Surgery

## 2024-06-13 ENCOUNTER — Ambulatory Visit (HOSPITAL_COMMUNITY)

## 2024-06-13 ENCOUNTER — Other Ambulatory Visit: Payer: Self-pay

## 2024-06-13 ENCOUNTER — Encounter (HOSPITAL_COMMUNITY): Payer: Self-pay | Admitting: Orthopedic Surgery

## 2024-06-13 ENCOUNTER — Inpatient Hospital Stay (HOSPITAL_COMMUNITY)
Admission: RE | Admit: 2024-06-13 | Discharge: 2024-06-18 | DRG: 904 | Disposition: A | Attending: Orthopedic Surgery | Admitting: Orthopedic Surgery

## 2024-06-13 DIAGNOSIS — Z79899 Other long term (current) drug therapy: Secondary | ICD-10-CM | POA: Diagnosis not present

## 2024-06-13 DIAGNOSIS — Z7982 Long term (current) use of aspirin: Secondary | ICD-10-CM | POA: Diagnosis not present

## 2024-06-13 DIAGNOSIS — Z5181 Encounter for therapeutic drug level monitoring: Secondary | ICD-10-CM | POA: Diagnosis not present

## 2024-06-13 DIAGNOSIS — Z882 Allergy status to sulfonamides status: Secondary | ICD-10-CM | POA: Diagnosis not present

## 2024-06-13 DIAGNOSIS — I160 Hypertensive urgency: Secondary | ICD-10-CM | POA: Diagnosis present

## 2024-06-13 DIAGNOSIS — L02415 Cutaneous abscess of right lower limb: Secondary | ICD-10-CM | POA: Diagnosis present

## 2024-06-13 DIAGNOSIS — Y848 Other medical procedures as the cause of abnormal reaction of the patient, or of later complication, without mention of misadventure at the time of the procedure: Secondary | ICD-10-CM | POA: Diagnosis present

## 2024-06-13 DIAGNOSIS — I96 Gangrene, not elsewhere classified: Secondary | ICD-10-CM | POA: Diagnosis present

## 2024-06-13 DIAGNOSIS — B965 Pseudomonas (aeruginosa) (mallei) (pseudomallei) as the cause of diseases classified elsewhere: Secondary | ICD-10-CM | POA: Diagnosis present

## 2024-06-13 DIAGNOSIS — N189 Chronic kidney disease, unspecified: Secondary | ICD-10-CM | POA: Diagnosis not present

## 2024-06-13 DIAGNOSIS — L97312 Non-pressure chronic ulcer of right ankle with fat layer exposed: Secondary | ICD-10-CM | POA: Diagnosis present

## 2024-06-13 DIAGNOSIS — L97513 Non-pressure chronic ulcer of other part of right foot with necrosis of muscle: Secondary | ICD-10-CM | POA: Diagnosis not present

## 2024-06-13 DIAGNOSIS — I129 Hypertensive chronic kidney disease with stage 1 through stage 4 chronic kidney disease, or unspecified chronic kidney disease: Secondary | ICD-10-CM | POA: Diagnosis present

## 2024-06-13 DIAGNOSIS — L97319 Non-pressure chronic ulcer of right ankle with unspecified severity: Secondary | ICD-10-CM | POA: Diagnosis not present

## 2024-06-13 DIAGNOSIS — S91001A Unspecified open wound, right ankle, initial encounter: Secondary | ICD-10-CM | POA: Diagnosis not present

## 2024-06-13 DIAGNOSIS — S90512A Abrasion, left ankle, initial encounter: Secondary | ICD-10-CM | POA: Diagnosis not present

## 2024-06-13 DIAGNOSIS — S90512D Abrasion, left ankle, subsequent encounter: Secondary | ICD-10-CM | POA: Diagnosis not present

## 2024-06-13 DIAGNOSIS — T8131XA Disruption of external operation (surgical) wound, not elsewhere classified, initial encounter: Secondary | ICD-10-CM | POA: Diagnosis present

## 2024-06-13 DIAGNOSIS — N1831 Chronic kidney disease, stage 3a: Secondary | ICD-10-CM | POA: Diagnosis present

## 2024-06-13 DIAGNOSIS — D72829 Elevated white blood cell count, unspecified: Secondary | ICD-10-CM | POA: Diagnosis present

## 2024-06-13 DIAGNOSIS — B961 Klebsiella pneumoniae [K. pneumoniae] as the cause of diseases classified elsewhere: Secondary | ICD-10-CM | POA: Diagnosis present

## 2024-06-13 DIAGNOSIS — L089 Local infection of the skin and subcutaneous tissue, unspecified: Secondary | ICD-10-CM | POA: Diagnosis not present

## 2024-06-13 HISTORY — DX: Essential (primary) hypertension: I10

## 2024-06-13 HISTORY — PX: INCISION AND DRAINAGE OF DEEP ABSCESS, CALF: SHX7361

## 2024-06-13 HISTORY — PX: APPLICATION OF WOUND VAC: SHX5189

## 2024-06-13 LAB — COMPREHENSIVE METABOLIC PANEL WITH GFR
ALT: 38 U/L (ref 0–44)
AST: 28 U/L (ref 15–41)
Albumin: 4.2 g/dL (ref 3.5–5.0)
Alkaline Phosphatase: 47 U/L (ref 38–126)
Anion gap: 14 (ref 5–15)
BUN: 13 mg/dL (ref 6–20)
CO2: 21 mmol/L — ABNORMAL LOW (ref 22–32)
Calcium: 9.2 mg/dL (ref 8.9–10.3)
Chloride: 104 mmol/L (ref 98–111)
Creatinine, Ser: 1.43 mg/dL — ABNORMAL HIGH (ref 0.61–1.24)
GFR, Estimated: 56 mL/min — ABNORMAL LOW (ref >60)
Glucose, Bld: 127 mg/dL — ABNORMAL HIGH (ref 70–99)
Potassium: 3.3 mmol/L — ABNORMAL LOW (ref 3.5–5.1)
Sodium: 139 mmol/L (ref 135–145)
Total Bilirubin: 0.9 mg/dL (ref 0.0–1.2)
Total Protein: 7.3 g/dL (ref 6.5–8.1)

## 2024-06-13 LAB — CBC WITH DIFFERENTIAL/PLATELET
Abs Immature Granulocytes: 0.03 K/uL (ref 0.00–0.07)
Basophils Absolute: 0.1 K/uL (ref 0.0–0.1)
Basophils Relative: 0 %
Eosinophils Absolute: 0 K/uL (ref 0.0–0.5)
Eosinophils Relative: 0 %
HCT: 42.2 % (ref 39.0–52.0)
Hemoglobin: 14 g/dL (ref 13.0–17.0)
Immature Granulocytes: 0 %
Lymphocytes Relative: 24 %
Lymphs Abs: 3.2 K/uL (ref 0.7–4.0)
MCH: 27.8 pg (ref 26.0–34.0)
MCHC: 33.2 g/dL (ref 30.0–36.0)
MCV: 83.7 fL (ref 80.0–100.0)
Monocytes Absolute: 0.6 K/uL (ref 0.1–1.0)
Monocytes Relative: 5 %
Neutro Abs: 9.5 K/uL — ABNORMAL HIGH (ref 1.7–7.7)
Neutrophils Relative %: 71 %
Platelets: 298 K/uL (ref 150–400)
RBC: 5.04 MIL/uL (ref 4.22–5.81)
RDW: 13.9 % (ref 11.5–15.5)
WBC: 13.4 K/uL — ABNORMAL HIGH (ref 4.0–10.5)
nRBC: 0 % (ref 0.0–0.2)

## 2024-06-13 LAB — SALICYLATE LEVEL: Salicylate Lvl: <7 mg/dL — ABNORMAL LOW (ref 7.0–30.0)

## 2024-06-13 MED ORDER — ROCURONIUM BROMIDE 10 MG/ML (PF) SYRINGE
PREFILLED_SYRINGE | INTRAVENOUS | Status: AC
  Filled 2024-06-13: qty 10

## 2024-06-13 MED ORDER — VITAMIN C 500 MG PO TABS
1000.0000 mg | ORAL_TABLET | Freq: Every day | ORAL | Status: DC
  Administered 2024-06-13 – 2024-06-18 (×5): 1000 mg via ORAL
  Filled 2024-06-13 (×6): qty 2

## 2024-06-13 MED ORDER — HYDROCODONE-ACETAMINOPHEN 7.5-325 MG PO TABS
1.0000 | ORAL_TABLET | ORAL | Status: DC | PRN
  Administered 2024-06-13 – 2024-06-14 (×2): 2 via ORAL
  Filled 2024-06-13 (×3): qty 2

## 2024-06-13 MED ORDER — TOBRAMYCIN SULFATE 1.2 G IJ SOLR
INTRAMUSCULAR | Status: DC | PRN
  Administered 2024-06-13: 1.2 g

## 2024-06-13 MED ORDER — AMISULPRIDE (ANTIEMETIC) 5 MG/2ML IV SOLN: 10.0000 mg | Freq: Once | INTRAVENOUS | Status: DC | PRN

## 2024-06-13 MED ORDER — FENTANYL CITRATE (PF) 100 MCG/2ML IJ SOLN
INTRAMUSCULAR | Status: AC
  Filled 2024-06-13: qty 2

## 2024-06-13 MED ORDER — CEFAZOLIN SODIUM-DEXTROSE 2-3 GM-%(50ML) IV SOLR
INTRAVENOUS | Status: DC | PRN
  Administered 2024-06-13: 2 g via INTRAVENOUS

## 2024-06-13 MED ORDER — EPHEDRINE 5 MG/ML INJ
INTRAVENOUS | Status: AC
  Filled 2024-06-13: qty 5

## 2024-06-13 MED ORDER — CHLORHEXIDINE GLUCONATE 0.12 % MT SOLN
15.0000 mL | Freq: Once | OROMUCOSAL | Status: AC
  Administered 2024-06-13: 15 mL via OROMUCOSAL
  Filled 2024-06-13: qty 15

## 2024-06-13 MED ORDER — VANCOMYCIN HCL 1000 MG IV SOLR
INTRAVENOUS | Status: AC
  Filled 2024-06-13: qty 20

## 2024-06-13 MED ORDER — MORPHINE SULFATE (PF) 2 MG/ML IV SOLN
0.5000 mg | INTRAVENOUS | Status: DC | PRN
  Administered 2024-06-13 – 2024-06-15 (×2): 1 mg via INTRAVENOUS
  Filled 2024-06-13 (×2): qty 1

## 2024-06-13 MED ORDER — ACETAMINOPHEN 500 MG PO TABS
500.0000 mg | ORAL_TABLET | Freq: Four times a day (QID) | ORAL | Status: AC
  Administered 2024-06-13: 500 mg via ORAL
  Filled 2024-06-13 (×3): qty 1

## 2024-06-13 MED ORDER — AMLODIPINE BESYLATE 5 MG PO TABS: 5.0000 mg | ORAL_TABLET | Freq: Every day | ORAL | Status: DC

## 2024-06-13 MED ORDER — METOCLOPRAMIDE HCL 5 MG PO TABS: 5.0000 mg | ORAL_TABLET | Freq: Three times a day (TID) | ORAL | Status: DC | PRN

## 2024-06-13 MED ORDER — PROPOFOL 10 MG/ML IV BOLUS
INTRAVENOUS | Status: DC | PRN
  Administered 2024-06-13: 20 mg via INTRAVENOUS

## 2024-06-13 MED ORDER — VANCOMYCIN HCL 1000 MG IV SOLR
INTRAVENOUS | Status: DC | PRN
  Administered 2024-06-13: 1000 mg

## 2024-06-13 MED ORDER — PROPOFOL 500 MG/50ML IV EMUL
INTRAVENOUS | Status: DC | PRN
  Administered 2024-06-13: 75 ug/kg/min via INTRAVENOUS

## 2024-06-13 MED ORDER — SODIUM CHLORIDE 0.9 % IV SOLN: INTRAVENOUS | Status: AC

## 2024-06-13 MED ORDER — OXYCODONE HCL 5 MG PO TABS: 5.0000 mg | ORAL_TABLET | Freq: Once | ORAL | Status: DC | PRN

## 2024-06-13 MED ORDER — ACETAMINOPHEN 325 MG PO TABS
325.0000 mg | ORAL_TABLET | Freq: Four times a day (QID) | ORAL | Status: DC | PRN
  Administered 2024-06-14: 500 mg via ORAL

## 2024-06-13 MED ORDER — LIDOCAINE 2% (20 MG/ML) 5 ML SYRINGE
INTRAMUSCULAR | Status: AC
  Filled 2024-06-13: qty 15

## 2024-06-13 MED ORDER — HYDROCODONE-ACETAMINOPHEN 5-325 MG PO TABS
1.0000 | ORAL_TABLET | ORAL | Status: DC | PRN
  Administered 2024-06-14 (×2): 1 via ORAL
  Filled 2024-06-13 (×2): qty 1

## 2024-06-13 MED ORDER — TOBRAMYCIN SULFATE 1.2 G IJ SOLR
INTRAMUSCULAR | Status: AC
  Filled 2024-06-13: qty 1.2

## 2024-06-13 MED ORDER — ASPIRIN 81 MG PO TBEC
81.0000 mg | DELAYED_RELEASE_TABLET | Freq: Every day | ORAL | Status: DC
  Administered 2024-06-13 – 2024-06-18 (×5): 81 mg via ORAL
  Filled 2024-06-13 (×6): qty 1

## 2024-06-13 MED ORDER — LACTATED RINGERS IV SOLN: INTRAVENOUS | Status: DC

## 2024-06-13 MED ORDER — OXYCODONE HCL 5 MG/5ML PO SOLN: 5.0000 mg | Freq: Once | ORAL | Status: DC | PRN

## 2024-06-13 MED ORDER — METOCLOPRAMIDE HCL 5 MG/ML IJ SOLN: 5.0000 mg | Freq: Three times a day (TID) | INTRAMUSCULAR | Status: DC | PRN

## 2024-06-13 MED ORDER — MIDAZOLAM HCL 2 MG/2ML IJ SOLN
INTRAMUSCULAR | Status: AC
  Filled 2024-06-13: qty 2

## 2024-06-13 MED ORDER — MIDAZOLAM HCL (PF) 2 MG/2ML IJ SOLN
INTRAMUSCULAR | Status: DC | PRN
  Administered 2024-06-13: 2 mg via INTRAVENOUS

## 2024-06-13 MED ORDER — ONDANSETRON HCL 4 MG/2ML IJ SOLN
INTRAMUSCULAR | Status: DC | PRN
  Administered 2024-06-13: 4 mg via INTRAVENOUS

## 2024-06-13 MED ORDER — PHENYLEPHRINE 80 MCG/ML (10ML) SYRINGE FOR IV PUSH (FOR BLOOD PRESSURE SUPPORT)
PREFILLED_SYRINGE | INTRAVENOUS | Status: AC
  Filled 2024-06-13: qty 40

## 2024-06-13 MED ORDER — ACETAMINOPHEN 10 MG/ML IV SOLN: 1000.0000 mg | Freq: Once | INTRAVENOUS | Status: DC | PRN

## 2024-06-13 MED ORDER — SODIUM CHLORIDE 0.9 % IV SOLN
4.0000 mg/kg | Freq: Every day | INTRAVENOUS | Status: DC
  Administered 2024-06-13: 350 mg via INTRAVENOUS
  Filled 2024-06-13 (×2): qty 7

## 2024-06-13 MED ORDER — ONDANSETRON HCL 4 MG PO TABS: 4.0000 mg | ORAL_TABLET | Freq: Four times a day (QID) | ORAL | Status: DC | PRN

## 2024-06-13 MED ORDER — ONDANSETRON HCL 4 MG/2ML IJ SOLN
INTRAMUSCULAR | Status: AC
  Filled 2024-06-13: qty 4

## 2024-06-13 MED ORDER — PHENYLEPHRINE 80 MCG/ML (10ML) SYRINGE FOR IV PUSH (FOR BLOOD PRESSURE SUPPORT)
PREFILLED_SYRINGE | INTRAVENOUS | Status: DC | PRN
  Administered 2024-06-13: 40 ug via INTRAVENOUS

## 2024-06-13 MED ORDER — DOCUSATE SODIUM 100 MG PO CAPS
100.0000 mg | ORAL_CAPSULE | Freq: Two times a day (BID) | ORAL | Status: DC
  Administered 2024-06-18: 13:00:00 100 mg via ORAL
  Filled 2024-06-13 (×10): qty 1

## 2024-06-13 MED ORDER — FENTANYL CITRATE (PF) 250 MCG/5ML IJ SOLN
INTRAMUSCULAR | Status: DC | PRN
  Administered 2024-06-13 (×4): 25 ug via INTRAVENOUS

## 2024-06-13 MED ORDER — FENTANYL CITRATE (PF) 100 MCG/2ML IJ SOLN: 25.0000 ug | INTRAMUSCULAR | Status: DC | PRN

## 2024-06-13 MED ORDER — VASHE WOUND IRRIGATION OPTIME
TOPICAL | Status: DC | PRN
  Administered 2024-06-13: 34 [oz_av]

## 2024-06-13 MED ORDER — ONDANSETRON HCL 4 MG/2ML IJ SOLN: 4.0000 mg | Freq: Once | INTRAMUSCULAR | Status: DC | PRN

## 2024-06-13 MED ORDER — ONDANSETRON HCL 4 MG/2ML IJ SOLN: 4.0000 mg | Freq: Four times a day (QID) | INTRAMUSCULAR | Status: DC | PRN

## 2024-06-13 MED ORDER — ORAL CARE MOUTH RINSE: 15.0000 mL | Freq: Once | OROMUCOSAL | Status: AC

## 2024-06-13 NOTE — Op Note (Signed)
 06/13/2024  2:40 PM  PATIENT:  Brandon Flynn    PRE-OPERATIVE DIAGNOSIS: Dehiscence surgical wound lateral right ankle  POST-OPERATIVE DIAGNOSIS:  Same  PROCEDURE: Excisional debridement right lateral ankle wound with excision skin soft tissue muscle and fascia. Stat Gram stain sent. Tissue sent for aerobic and anaerobic cultures. Tissue sent for fungal and AFB cultures. Application Kerecis micro graft 38 cm. Application 1 g vancomycin  powder and 1.2 g tobramycin  powder. Application of cleanse choice wound VAC sponge in the peel and place wound VAC sponge.  SURGEON:  Jerona LULLA Sage, MD  PHYSICIAN ASSISTANT:None ANESTHESIA:   General  PREOPERATIVE INDICATIONS:  Brandon Flynn is a  58 y.o. male with a diagnosis of Chronic Wound Right Ankle who failed conservative measures and elected for surgical management.    The risks benefits and alternatives were discussed with the patient preoperatively including but not limited to the risks of infection, bleeding, nerve injury, cardiopulmonary complications, the need for revision surgery, among others, and the patient was willing to proceed.  OPERATIVE IMPLANTS:   Implant Name Type Inv. Item Serial No. Manufacturer Lot No. LRB No. Used Action  GRAFT SKIN WND MICRO 38 - [id number] Tissue GRAFT SKIN WND MICRO 38  [employer] [id number] Right 1 Implanted    @ENCIMAGES @  OPERATIVE FINDINGS: Patient had a large hematoma with ischemic skin and soft tissue muscle and fascia.  Ischemic changes possibly secondary to hypertension uncontrolled  OPERATIVE PROCEDURE: Patient brought the operating room after undergoing a regional anesthetic.  After adequate levels anesthesia obtained patient's right lower extremity was prepped using DuraPrep draped into a sterile field a timeout was called.  The sutures were removed and the necrotic skin soft tissue muscle and fascia was excised with a 21 blade knife and a rondure from the lateral aspect  of the right ankle.  After debridement the wound was 10 x 4 cm.  This was debrided back to bleeding viable healthy tissue.  Most of the tissue was ischemic there is no purulence.  Ischemic changes possibly secondary to the uncontrolled hypertension.  The wound was then irrigated with Vashe.  Tissue was sent for aerobic and anaerobic cultures, stat Gram stain, fungal, AFB.  Antibiotics were provided after cultures were obtained.  The wound bed was filled with 38 cm of Kerecis micro graft as well as 1 g vancomycin  powder and 1.2 g of tobramycin  powder for wound surface area greater than 40 cm.  This was covered with the peel in place wound VAC sponge and the cleanse choice wound VAC sponge was applied to the open wound.  This had a good suction fit patient was taken the PACU in stable condition   DISCHARGE PLANNING:  Antibiotic duration: Continue antibiotics based on infectious disease recommendations  Weightbearing: Nonweightbearing on the right  Pain medication: Opioid pathway  Dressing care/ Wound VAC: Wound VAC  Ambulatory devices: Walker or crutches  Discharge to: Plan for return to the operating room on Friday  Follow-up: In the office 1 week post operative.

## 2024-06-13 NOTE — Interval H&P Note (Signed)
 History and Physical Interval Note:  06/13/2024 1:51 PM  Brandon Flynn  has presented today for surgery, with the diagnosis of Chronic Wound Right Ankle.  The various methods of treatment have been discussed with the patient and family. After consideration of risks, benefits and other options for treatment, the patient has consented to  Procedure(s) with comments: INCISION AND DRAINAGE OF DEEP ABSCESS, CALF (Right) - RIGHT ANKLE IRRIGATION AND DEBRIDEMENT as a surgical intervention.  The patient's history has been reviewed, patient examined, no change in status, stable for surgery.  I have reviewed the patient's chart and labs.  Questions were answered to the patient's satisfaction.     Ky Rumple V Steele Stracener

## 2024-06-13 NOTE — Anesthesia Preprocedure Evaluation (Addendum)
 Anesthesia Evaluation  Patient identified by MRN, date of birth, ID band Patient awake    Reviewed: Allergy & Precautions, NPO status , Patient's Chart, lab work & pertinent test results  History of Anesthesia Complications Negative for: history of anesthetic complications  Airway Mallampati: I  TM Distance: >3 FB Neck ROM: Full    Dental  (+) Teeth Intact, Dental Advisory Given   Pulmonary    breath sounds clear to auscultation       Cardiovascular hypertension, Pt. on medications  Rhythm:Regular Rate:Normal     Neuro/Psych    GI/Hepatic   Endo/Other  neg diabetes    Renal/GU      Musculoskeletal   Abdominal   Peds  Hematology  (+) JEHOVAH'S WITNESSHgb 14.0, Plts 298K (06/13/24)   Anesthesia Other Findings   Reproductive/Obstetrics                              Anesthesia Physical Anesthesia Plan  ASA: 2  Anesthesia Plan: Regional   Post-op Pain Management:    Induction: Intravenous  PONV Risk Score and Plan: 1 and Treatment may vary due to age or medical condition  Airway Management Planned: Simple Face Mask and Natural Airway  Additional Equipment: None  Intra-op Plan:   Post-operative Plan: Extubation in OR  Informed Consent:      Dental advisory given  Plan Discussed with: CRNA  Anesthesia Plan Comments:         Anesthesia Quick Evaluation

## 2024-06-13 NOTE — Transfer of Care (Signed)
 Immediate Anesthesia Transfer of Care Note  Patient: Brandon Flynn  Procedure(s) Performed: RIGHT CALF INCISION AND DRAINAGE OF DEEP ABSCESS (Right: Ankle) WOUND VAC APPLICATION (Right: Ankle)  Patient Location: PACU  Anesthesia Type:MAC and Regional  Level of Consciousness: drowsy and patient cooperative  Airway & Oxygen Therapy: Patient Spontanous Breathing  Post-op Assessment: Report given to RN, Post -op Vital signs reviewed and stable, and Patient moving all extremities X 4  Post vital signs: Reviewed and stable  Last Vitals:  Vitals Value Taken Time  BP 152/107 06/13/24 14:41  Temp    Pulse 83 06/13/24 14:43  Resp 8 06/13/24 14:43  SpO2 95 % 06/13/24 14:43  Vitals shown include unfiled device data.  Last Pain:  Vitals:   06/13/24 1159  TempSrc:   PainSc: 0-No pain         Complications: No notable events documented.

## 2024-06-13 NOTE — Progress Notes (Signed)
 Pharmacy Antibiotic Note  Brandon Flynn is a 58 y.o. male admitted on 06/13/2024 with R ankle wound infection - s/p debridement 12/10.  Pharmacy has been consulted for Daptomycin  dosing.  Plan: Daptomycin  350mg  (4mg /kg) IV every 24 hours Will f/u renal function, micro data, and pt's clinical condition CK qweek  Height: 5' 10 (177.8 cm) Weight: 93 kg (205 lb) IBW/kg (Calculated) : 73  Temp (24hrs), Avg:98.6 F (37 C), Min:98 F (36.7 C), Max:99.4 F (37.4 C)  Recent Labs  Lab 06/13/24 1114  WBC 13.4*  CREATININE 1.43*    Estimated Creatinine Clearance: 63.7 mL/min (A) (by C-G formula based on SCr of 1.43 mg/dL (H)).    Allergies  Allergen Reactions   Sulfa Antibiotics Hives and Itching    Antimicrobials this admission: 12/10 Daptomycin  >>   Microbiology results: 12/10 R ankle tissue:  Thank you for allowing pharmacy to be a part of this patients care.  Vito Ralph, PharmD, BCPS Please see amion for complete clinical pharmacist phone list 06/13/2024 3:47 PM

## 2024-06-14 ENCOUNTER — Encounter (HOSPITAL_COMMUNITY): Payer: Self-pay | Admitting: Orthopedic Surgery

## 2024-06-14 DIAGNOSIS — I160 Hypertensive urgency: Secondary | ICD-10-CM | POA: Diagnosis not present

## 2024-06-14 DIAGNOSIS — S90512A Abrasion, left ankle, initial encounter: Secondary | ICD-10-CM

## 2024-06-14 DIAGNOSIS — L089 Local infection of the skin and subcutaneous tissue, unspecified: Secondary | ICD-10-CM | POA: Diagnosis present

## 2024-06-14 LAB — CBC
HCT: 36.2 % — ABNORMAL LOW (ref 39.0–52.0)
Hemoglobin: 12 g/dL — ABNORMAL LOW (ref 13.0–17.0)
MCH: 27.4 pg (ref 26.0–34.0)
MCHC: 33.1 g/dL (ref 30.0–36.0)
MCV: 82.6 fL (ref 80.0–100.0)
Platelets: 264 K/uL (ref 150–400)
RBC: 4.38 MIL/uL (ref 4.22–5.81)
RDW: 14 % (ref 11.5–15.5)
WBC: 11.7 K/uL — ABNORMAL HIGH (ref 4.0–10.5)
nRBC: 0 % (ref 0.0–0.2)

## 2024-06-14 LAB — BASIC METABOLIC PANEL WITH GFR
Anion gap: 6 (ref 5–15)
BUN: 12 mg/dL (ref 6–20)
CO2: 29 mmol/L (ref 22–32)
Calcium: 8.3 mg/dL — ABNORMAL LOW (ref 8.9–10.3)
Chloride: 103 mmol/L (ref 98–111)
Creatinine, Ser: 1.34 mg/dL — ABNORMAL HIGH (ref 0.61–1.24)
GFR, Estimated: >60 mL/min (ref >60)
Glucose, Bld: 93 mg/dL (ref 70–99)
Potassium: 3.1 mmol/L — ABNORMAL LOW (ref 3.5–5.1)
Sodium: 138 mmol/L (ref 135–145)

## 2024-06-14 LAB — CK: Total CK: 170 U/L (ref 49–397)

## 2024-06-14 MED ORDER — IRBESARTAN 300 MG PO TABS
150.0000 mg | ORAL_TABLET | Freq: Every day | ORAL | Status: DC
  Administered 2024-06-14 – 2024-06-18 (×4): 150 mg via ORAL
  Filled 2024-06-14 (×5): qty 1

## 2024-06-14 MED ORDER — POVIDONE-IODINE 10 % EX SWAB
2.0000 | Freq: Once | CUTANEOUS | Status: AC
  Administered 2024-06-15: 2 via TOPICAL

## 2024-06-14 MED ORDER — AMLODIPINE BESYLATE 10 MG PO TABS
10.0000 mg | ORAL_TABLET | Freq: Every day | ORAL | Status: DC
  Administered 2024-06-14 – 2024-06-18 (×4): 10 mg via ORAL
  Filled 2024-06-14 (×5): qty 1

## 2024-06-14 MED ORDER — CHLORHEXIDINE GLUCONATE 4 % EX SOLN
60.0000 mL | Freq: Once | CUTANEOUS | Status: DC
  Filled 2024-06-14: qty 60

## 2024-06-14 MED ORDER — POTASSIUM CHLORIDE CRYS ER 20 MEQ PO TBCR
40.0000 meq | EXTENDED_RELEASE_TABLET | Freq: Once | ORAL | Status: AC
  Administered 2024-06-14: 40 meq via ORAL
  Filled 2024-06-14: qty 2

## 2024-06-14 MED ORDER — HYDRALAZINE HCL 20 MG/ML IJ SOLN
10.0000 mg | INTRAMUSCULAR | Status: DC | PRN
  Administered 2024-06-15: 10 mg via INTRAVENOUS
  Filled 2024-06-14: qty 1

## 2024-06-14 MED ORDER — SODIUM CHLORIDE 0.9 % IV SOLN
2.0000 g | Freq: Three times a day (TID) | INTRAVENOUS | Status: DC
  Administered 2024-06-14 – 2024-06-18 (×12): 2 g via INTRAVENOUS
  Filled 2024-06-14 (×12): qty 12.5

## 2024-06-14 MED ORDER — DAPTOMYCIN-SODIUM CHLORIDE 700-0.9 MG/100ML-% IV SOLN
8.0000 mg/kg | Freq: Every day | INTRAVENOUS | Status: DC
  Administered 2024-06-14: 700 mg via INTRAVENOUS
  Filled 2024-06-14 (×3): qty 100

## 2024-06-14 MED ORDER — MEPIVACAINE HCL (PF) 1.5 % IJ SOLN
INTRAMUSCULAR | Status: DC | PRN
  Administered 2024-06-13: 27 mL via EPIDURAL

## 2024-06-14 NOTE — Anesthesia Procedure Notes (Signed)
 Anesthesia Regional Block: Popliteal block   Pre-Anesthetic Checklist: , timeout performed,  Correct Patient, Correct Site, Correct Laterality,  Correct Procedure, Correct Position, site marked,  Risks and benefits discussed,  Surgical consent,  Pre-op evaluation,  At surgeon's request and post-op pain management  Laterality: Lower and Right  Prep: chloraprep       Needles:  Injection technique: Single-shot  Needle Type: Echogenic Stimulator Needle     Needle Length: 9cm  Needle Gauge: 20     Additional Needles:   Procedures:,,,, ultrasound used (permanent image in chart),, #20gu IV placed    Narrative:  Start time: 06/13/2024 2:03 PM End time: 06/13/2024 2:03 PM Injection made incrementally with aspirations every 5 mL.  Performed by: Personally  Anesthesiologist: Colhoun, Lauraine DASEN, MD

## 2024-06-14 NOTE — Consult Note (Addendum)
 Initial Consultation Note   Patient: Brandon Flynn FMW:[id number] DOB: 12-Aug-1965 PCP: [name], MD DOA: 06/13/2024 DOS: the patient was seen and examined on 06/14/2024 Primary service: Harden Jerona GAILS, MD  Referring physician: Jerona Harden, MD Reason for consult: Blood pressure control   Assessment and Plan: Skin ulcer of the right ankle Patient postop day 1 from recent debridement by Dr. Harden and currently has a wound VAC in place.  Prior cultures from 11/14 grew Staphylococcus epidermidis (MRSE) and dipthroids (corynebacterium species).  ID was consulted and patient was started on empiric antibiotics of daptomycin  and cefepime. - Follow-up repeat cultures - Continue antibiotics per ID - Per orthopedics  Hypertensive urgency Blood pressures 203/121 here in the hospital.  Patient had been on amlodipine 5 mg daily and had recently talked to the primary care provider to start valsartan 160 mg which prescription has been sent to his pharmacy.  They had discussed plans for starting him on a combination medication in the near future. - Increase amlodipine to 10 mg daily - Start pharmacy substitution of irbesartan for valsartan 160 mg which patient had plan to start in outpatient setting with primary care provider - Hydralazine  IV as needed for elevated blood pressures - Ultimately at discharge patient should follow-up with a primary care provider for final adjustment of medications.  [facility] will continue to follow the patient.  HPI: Brandon Flynn is a 58 y.o. male with past medical history of hypertension presents with a foot wound.  He is accompanied by his wife.  The foot wound began in August as a small lesion and became infected after a visit to the beach. Initial treatment attempts were unsuccessful, leading to a referral to a specialist. The wound was caused by a tick bite, which allowed bacteria from the beach water to enter, resulting in necrosis.  He was taken  for debridement of the yesterday by Dr. Harden.  Cultures growing gram-positive cocci and gram-positive rod  He has a history of hypertension, currently managed with amlodipine 5 mg. His blood pressure has been recorded as high as over 200/100 mmHg during stressful situations, but typically ranges in the 160s to 170s. He admits to being 'bad about drinking water,' which may impact his hydration status and potentially his kidney function.  Review of Systems: As mentioned in the history of present illness. All other systems reviewed and are negative. Past Medical History:  Diagnosis Date   Hypertension    Past Surgical History:  Procedure Laterality Date   APPLICATION OF WOUND VAC Right 06/13/2024   Procedure: WOUND VAC APPLICATION;  Surgeon: Harden Jerona GAILS, MD;  Location: [facility] OR;  Service: Orthopedics;  Laterality: Right;   INCISION AND DRAINAGE OF DEEP ABSCESS, CALF Right 05/18/2024   Procedure: INCISION AND DRAINAGE OF DEEP ABSCESS, CALF;  Surgeon: Harden Jerona GAILS, MD;  Location: [facility] OR;  Service: Orthopedics;  Laterality: Right;  RIGHT ANKLE DEBRIDEMENT AND TISSUE GRAFT   INCISION AND DRAINAGE OF DEEP ABSCESS, CALF Right 06/13/2024   Procedure: RIGHT CALF INCISION AND DRAINAGE OF DEEP ABSCESS;  Surgeon: Harden Jerona GAILS, MD;  Location: [facility] OR;  Service: Orthopedics;  Laterality: Right;   MOUTH SURGERY     Social History:  reports that he has never smoked. He has never used smokeless tobacco. He reports current alcohol use of about 5.0 standard drinks of alcohol per week. He reports that he does not currently use drugs.  Allergies[1]  History reviewed. No pertinent family history.  Prior to  Admission medications  Medication Sig Start Date End Date Taking? Authorizing Provider  amLODipine (NORVASC) 5 MG tablet Take 5 mg by mouth daily. 05/24/24 05/24/25 Yes [provider]  Ascorbic Acid  (VITAMIN C) 1000 MG tablet Take 1,000 mg by mouth daily.   Yes [provider]   HYDROcodone -acetaminophen  (NORCO/VICODIN) 5-325 MG tablet Take 1 tablet by mouth every 4 (four) hours as needed for moderate pain (pain score 4-6). Patient taking differently: Take 1 tablet by mouth at bedtime. 05/18/24  Yes Gerome Maurilio HERO, PA-C  ibuprofen (ADVIL) 400 MG tablet Take 400 mg by mouth daily.   Yes [provider]    Physical Exam: Vitals:   06/13/24 1654 06/13/24 2119 06/14/24 0449 06/14/24 0729  BP: (!) 179/116 (!) 162/93 (!) 145/88 (!) 148/98  Pulse: 85 79 68 66  Resp: 19   14  Temp: 98.4 F (36.9 C) 99.2 F (37.3 C) 98.4 F (36.9 C) 98.2 F (36.8 C)  TempSrc:  Oral Oral Oral  SpO2: 99% 97% 97% 97%  Weight: 97.5 kg     Height: 5' 10 (1.778 m)      Constitutional: Middle-aged adult male currently in no acute distress Eyes: PERRL, lids and conjunctivae normal ENMT: Mucous membranes are moist. Normal dentition.  Neck: normal, supple, no masses, no thyromegaly Respiratory: clear to auscultation bilaterally, no wheezing, no crackles. Normal respiratory effort. No accessory muscle use.  Cardiovascular: Regular rate and rhythm, soft murmur present. Abdomen: no tenderness, no masses palpated.  Bowel sounds positive.  Skin: Wound of right leg currently bandaged with wound VAC in place Neurologic: CN 2-12 grossly intact.  Strength 5/5 in all 4.    Data Reviewed:   Reviewed labs, imaging, and pertinent records as documented.   Family Communication: Wife updated at bedside Primary team communication:  Thank you very much for involving us  in the care of your patient.  Author: Maximino DELENA Sharps, MD 06/14/2024 8:45 AM  For on call review [email or web address].      [1]  Allergies Allergen Reactions   Sulfa Antibiotics Hives and Itching

## 2024-06-14 NOTE — Progress Notes (Signed)
 Patient ID: Brandon Flynn, male   DOB: July 05, 1966, 58 y.o.   MRN: [id number]  Patient is postoperative day 1 debridement wound dehiscence right ankle.  Stat Gram stain is showing gram-positive cocci and tissue cultures are showing gram-positive cocci and gram-negative rods.  Patient had persistent elevated blood pressure prior to surgery despite initial intervention for blood pressure control.  Will consult the hospitalist for improved blood pressure control.  I feel the wound dehiscence is most likely secondary to the hypertension.  Plan for repeat debridement tomorrow.

## 2024-06-14 NOTE — H&P (View-Only) (Signed)
 Patient ID: Brandon Flynn, male   DOB: July 05, 1966, 58 y.o.   MRN: [id number]  Patient is postoperative day 1 debridement wound dehiscence right ankle.  Stat Gram stain is showing gram-positive cocci and tissue cultures are showing gram-positive cocci and gram-negative rods.  Patient had persistent elevated blood pressure prior to surgery despite initial intervention for blood pressure control.  Will consult the hospitalist for improved blood pressure control.  I feel the wound dehiscence is most likely secondary to the hypertension.  Plan for repeat debridement tomorrow.

## 2024-06-14 NOTE — Consult Note (Cosign Needed Addendum)
 [facility]    Date of Admission:  06/13/2024     Total days of antibiotics 2               Reason for Consult: Dehiscence of surgical wound  Referring Provider: Dr. Harden Primary Care Provider: Esmeralda Ewings, Anya, MD   ASSESSMENT:  Mr. Craddock is a 58 y/o caucasian male with hypertension, chronic kidney disease and recent debridement of a suspected tick bite with cultures growing MRSE and dipthroids presenting for non-healing wound and repeat debridements. POD #1 with surgical specimens showing gram positive cocci and gram positive rods and cultures pending with no purulence seen during debridement. Surgical cultures now growing Klebsiella aerogenes and Pseudomonas aeruginosa. Discussed plan of care to continue with current dose of daptomycin  while awaiting culture results and add cefepime. Therapeutic drug monitoring of CK levels and for eosinophilic pneumonia. Standard/universal precautions. Post-operative wound care and remaining medical and supportive care per Orthopedics.   PLAN:  Continue current dose of Daptomycin . Start Cefepime for Pseudomonas coverage.  Therapeutic drug monitoring of CK levels and for eosinophilic pneumonia. Monitor surgical specimens for results and adjust antibiotics accordingly.  Standard/universal precautions.  Post-operative wound care and remaining medical and supportive care per Orthopedics.   Principal Problem:   Ankle abrasion with infection, left, initial encounter    acetaminophen   500 mg Oral Q6H   amLODipine  10 mg Oral Daily   vitamin C  1,000 mg Oral Daily   aspirin  EC  81 mg Oral Daily   chlorhexidine   60 mL Topical Once   docusate sodium   100 mg Oral BID   irbesartan  150 mg Oral Daily   povidone-iodine  2 Application Topical Once    HPI: DIAR BERKEL is a 58 y.o. male with previous medical history of hypertension, chronic kidney disease Stage 3a and chronic skin ulcer presenting to  the hospital for repeat irrigation and debridement of his right ankle non-healing wound.   Mr. Wiseman initially sustained a tick bite to his right lateral ankle that did not heal despite mupirocin and doxycycline. There was concern for possible wound contamination after being in warm water at the beach. Brought to the OR on 05/18/24 for necrotic ulcer of the right ankle with excisional debridement performed. Surgical specimens grew Staphylococcus epidermidis (MRSE) and dipthroids (corynebacterium species). Seen for follow up in 05/28/24 with well approximated sutures and a 1 cm serous blister proximal to the incision. Was placed on ciprofloxacin  500 mg PO bid for 10 days. There was concern for poor healing on follow up with Orthopedics on 06/06/24 with ciprofloxacin  continued at that time and Vashe to dry dressings ordered. Seen on 06/11/24 with plan for repeat irrigation and debridement. Consulted with Dr. Fleeta Rothman recommending Zyvox for 2 weeks and re-assess.   Mr. Markell presents to the hospital for irrigation and debridement performed on 06/13/24. Afebrile with leukocytosis with WBC count of 13,400. Necrotic tissue was debrided with no purulence encountered. Tissue was sent for culture. There was ischemic change possibly related to uncontrolled hypertension. Cultures are pending and started on Daptomycin .   Review of Systems: Review of Systems  Constitutional:  Negative for chills, fever and weight loss.  Respiratory:  Negative for cough, shortness of breath and wheezing.   Cardiovascular:  Negative for chest pain and leg swelling.  Gastrointestinal:  Negative for abdominal pain, constipation, diarrhea, nausea and vomiting.  Skin:  Negative for rash.     Past Medical History:  Diagnosis Date   Hypertension     Social History[1]  History reviewed. No pertinent family history.  Allergies[2]  OBJECTIVE: Blood pressure (!) 148/98, pulse 66, temperature 98.2 F (36.8 C), temperature  source Oral, resp. rate 14, height 5' 10 (1.778 m), weight 97.5 kg, SpO2 97%.  Physical Exam Constitutional:      General: He is not in acute distress.    Appearance: He is well-developed.  Cardiovascular:     Rate and Rhythm: Normal rate and regular rhythm.     Heart sounds: Normal heart sounds.  Pulmonary:     Effort: Pulmonary effort is normal.     Breath sounds: Normal breath sounds.  Skin:    General: Skin is warm and dry.  Neurological:     Mental Status: He is alert and oriented to person, place, and time.     Lab Results Lab Results  Component Value Date   WBC 11.7 (H) 06/14/2024   HGB 12.0 (L) 06/14/2024   HCT 36.2 (L) 06/14/2024   MCV 82.6 06/14/2024   PLT 264 06/14/2024    Lab Results  Component Value Date   CREATININE 1.34 (H) 06/14/2024   BUN 12 06/14/2024   NA 138 06/14/2024   K 3.1 (L) 06/14/2024   CL 103 06/14/2024   CO2 29 06/14/2024    Lab Results  Component Value Date   ALT 38 06/13/2024   AST 28 06/13/2024   ALKPHOS 47 06/13/2024   BILITOT 0.9 06/13/2024     Microbiology: Recent Results (from the past 240 hours)  Aerobic/Anaerobic Culture w Gram Stain (surgical/deep wound)     Status: None (Preliminary result)   Collection Time: 06/13/24  2:23 PM   Specimen: Soft Tissue, Other  Result Value Ref Range Status   Specimen Description TISSUE  Final   Special Requests STAT RIGHT ANKLE CULTURE  Final   Gram Stain   Final    RARE WBC PRESENT,BOTH PMN AND MONONUCLEAR RARE GRAM POSITIVE COCCI IN PAIRS    Culture   Final    CULTURE REINCUBATED FOR BETTER GROWTH Performed at [facility], [address] [place], [place] [address]    Report Status PENDING  Incomplete  Aerobic/Anaerobic Culture w Gram Stain (surgical/deep wound)     Status: None (Preliminary result)   Collection Time: 06/13/24  2:24 PM   Specimen: Soft Tissue, Other  Result Value Ref Range Status   Specimen Description TISSUE RIGHT ANKLE  Final   Special Requests PT ON  VANC, TOBRAMYCIN   Final   Gram Stain   Final    RARE WBC PRESENT, PREDOMINANTLY PMN FEW GRAM POSITIVE COCCI FEW GRAM POSITIVE RODS    Culture   Final    CULTURE REINCUBATED FOR BETTER GROWTH Performed at [facility], [address] [place], [place] [address]    Report Status PENDING  Incomplete     Cathlyn July, NP [facility]  06/14/2024  12:51 PM     [1]  Social History Tobacco Use   Smoking status: Never   Smokeless tobacco: Never  Vaping Use   Vaping status: Never Used  Substance Use Topics   Alcohol use: Yes    Alcohol/week: 5.0 standard drinks of alcohol    Types: 5 Standard drinks or equivalent per week   Drug use: Not Currently  [2]  Allergies Allergen Reactions   Sulfa Antibiotics Hives and Itching

## 2024-06-14 NOTE — Plan of Care (Signed)

## 2024-06-14 NOTE — Progress Notes (Signed)
 [facility]  Patient has uncontrolled HTN.  I have consult TH to help with BP control and make recommendations.    Dr. Arley Sharps will see him today.   [name] PA-C

## 2024-06-14 NOTE — Anesthesia Postprocedure Evaluation (Signed)
 Anesthesia Post Note  Patient: Brandon Flynn  Procedure(s) Performed: RIGHT CALF INCISION AND DRAINAGE OF DEEP ABSCESS (Right: Ankle) WOUND VAC APPLICATION (Right: Ankle)     Patient location during evaluation: PACU Anesthesia Type: Regional Level of consciousness: awake Pain management: pain level controlled Vital Signs Assessment: post-procedure vital signs reviewed and stable Respiratory status: spontaneous breathing Cardiovascular status: blood pressure returned to baseline Postop Assessment: no apparent nausea or vomiting Anesthetic complications: no   No notable events documented.                Lauraine DASEN Colhoun

## 2024-06-14 NOTE — Evaluation (Signed)
 Physical Therapy Evaluation Patient Details Name: Brandon Flynn MRN: [id number] DOB: 04-04-1966 Today's Date: 06/14/2024  History of Present Illness  Pt is a 58 y.o. male admitted 12/10 with nonhealing R ankle wound. He underwent I&D. Plans for repeat I&D 12/12. PMH:  I&D R ankle 11/14, HTN  Clinical Impression  Pt admitted with above diagnosis. PTA pt lived at home with wife, I/mod I mobility. He has been using crutches since initial I&D R ankle 11/14. Pt currently with functional limitations due to the deficits listed below (see PT Problem List). On eval, pt required supervision bed mobility, CGA transfers, and CGA amb 25' with RW. Pt demo good ability to maintain NWB RLE. Distance limited by pain RLE in dependent position. Pt will benefit from acute skilled PT to increase their independence and safety with mobility to allow discharge. Plan is for return to OR for repeat I&D tomorrow, 12/12. Will follow up post surgery to assess needs. Currently no recommendations for follow up services. For DME, will determine RW vs knee scooter if pt remains NWB RLE.         If plan is discharge home, recommend the following: A little help with walking and/or transfers;A little help with bathing/dressing/bathroom   Can travel by private vehicle        Equipment Recommendations Rolling walker (2 wheels) (vs knee scooter, TBD)  Recommendations for Other Services       Functional Status Assessment Patient has had a recent decline in their functional status and demonstrates the ability to make significant improvements in function in a reasonable and predictable amount of time.     Precautions / Restrictions Precautions Precautions: Other (comment) Precaution/Restrictions Comments: wound vac RLE Restrictions Weight Bearing Restrictions Per Provider Order: Yes RLE Weight Bearing Per Provider Order: Non weight bearing      Mobility  Bed Mobility Overal bed mobility: Needs Assistance Bed  Mobility: Supine to Sit, Sit to Supine     Supine to sit: Supervision Sit to supine: Supervision   General bed mobility comments: supervision for lines    Transfers Overall transfer level: Needs assistance Equipment used: Rolling walker (2 wheels) Transfers: Sit to/from Stand Sit to Stand: Contact guard assist           General transfer comment: CGA for safety/lines, cues for sequencing    Ambulation/Gait Ambulation/Gait assistance: Contact guard assist Gait Distance (Feet): 25 Feet Assistive device: Rolling walker (2 wheels) Gait Pattern/deviations: Step-to pattern Gait velocity: decreased     General Gait Details: Pt demo good ability to maintain NWB RLE. No LOB noted. Distance limited by pain with RLE in dependent position.  Stairs            Wheelchair Mobility     Tilt Bed    Modified Rankin (Stroke Patients Only)       Balance Overall balance assessment: Mild deficits observed, not formally tested                                           Pertinent Vitals/Pain Pain Assessment Pain Assessment: 0-10 Pain Score: 4  Pain Location: R foot/ankle Pain Descriptors / Indicators: Sore, Tender Pain Intervention(s): Monitored during session, Limited activity within patient's tolerance    Home Living Family/patient expects to be discharged to:: Private residence Living Arrangements: Spouse/significant other Available Help at Discharge: Family Type of Home: House Home Access: Stairs to enter  Entrance Stairs-Rails: Left (half wall with railing on R) Entrance Stairs-Number of Steps: 13   Home Layout: Other (Comment);Able to live on main level with bedroom/bathroom (split level) Home Equipment: Crutches;Toilet riser Additional Comments: Pt's wife is an [employer] at [employer] in [place].    Prior Function Prior Level of Function : Independent/Modified Independent             Mobility Comments: Pt has been WBAT in cam boot and using crutches  since initial I&D 11/14 ADLs Comments: mod I     Extremity/Trunk Assessment   Upper Extremity Assessment Upper Extremity Assessment: Overall WFL for tasks assessed    Lower Extremity Assessment Lower Extremity Assessment: RLE deficits/detail RLE Deficits / Details: s/p I&D R ankle, wound vac in place    Cervical / Trunk Assessment Cervical / Trunk Assessment: Normal  Communication   Communication Communication: No apparent difficulties    Cognition Arousal: Alert Behavior During Therapy: WFL for tasks assessed/performed   PT - Cognitive impairments: No apparent impairments                         Following commands: Intact       Cueing Cueing Techniques: Verbal cues     General Comments      Exercises     Assessment/Plan    PT Assessment Patient needs continued PT services  PT Problem List Decreased mobility;Decreased knowledge of precautions;Decreased activity tolerance;Decreased balance;Decreased knowledge of use of DME;Pain       PT Treatment Interventions DME instruction;Therapeutic exercise;Gait training;Balance training;Stair training;Functional mobility training;Therapeutic activities;Patient/family education    PT Goals (Current goals can be found in the Care Plan section)  Acute Rehab PT Goals Patient Stated Goal: home PT Goal Formulation: With patient Time For Goal Achievement: 06/28/24 Potential to Achieve Goals: Good    Frequency Min 2X/week     Co-evaluation               AM-PAC PT 6 Clicks Mobility  Outcome Measure Help needed turning from your back to your side while in a flat bed without using bedrails?: None Help needed moving from lying on your back to sitting on the side of a flat bed without using bedrails?: A Little Help needed moving to and from a bed to a chair (including a wheelchair)?: A Little Help needed standing up from a chair using your arms (e.g., wheelchair or bedside chair)?: A Little Help needed to  walk in hospital room?: A Little Help needed climbing 3-5 steps with a railing? : A Lot 6 Click Score: 18    End of Session Equipment Utilized During Treatment: Gait belt Activity Tolerance: Patient tolerated treatment well Patient left: Other (comment);with family/visitor present;with call bell/phone within reach (in bathroom. NT aware.) Nurse Communication: Mobility status PT Visit Diagnosis: Difficulty in walking, not elsewhere classified (R26.2);Pain Pain - Right/Left: Right Pain - part of body: Ankle and joints of foot    Time: 0825-0842 PT Time Calculation (min) (ACUTE ONLY): 17 min   Charges:   PT Evaluation $PT Eval Moderate Complexity: 1 Mod   PT General Charges $$ ACUTE PT VISIT: 1 Visit         Sari MATSU., PT  Office # [phone]   Erven Sari Shaker 06/14/2024, 9:26 AM

## 2024-06-14 NOTE — Progress Notes (Signed)
 Pharmacy: Antimicrobial Stewardship Note  70 YOM with R-ankle wound dehiscence, original debridement 11/14 with cultures that grew MRSE, repeat OR 12/10 for I&D with cultures updated to show:  Aerobic/Anaerobic Culture w Gram Stain (surgical/deep wound) [[id number]] Collected: 06/13/24 1423  Order Status: Completed Specimen: Soft Tissue, Other Updated: 06/14/24 1403   Specimen Description TISSUE   Special Requests STAT RIGHT ANKLE CULTURE   Gram Stain --   RARE WBC PRESENT,BOTH PMN AND MONONUCLEAR RARE GRAM POSITIVE COCCI IN PAIRS   Culture --   FEW PSEUDOMONAS AERUGINOSA RARE KLEBSIELLA AEROGENES CULTURE REINCUBATED FOR BETTER GROWTH Performed at [facility], [address] [place], [place] [address]   Report Status PENDING   Will add Cefepime to cover for the PsA + Kleb aerogenes.   Plan - Add Cefepime 2g IV every 8 hours - Continue Daptomycin  700 mg IV every 24 hours - Will monitor additional culture updates and sensitivities, renal function for dose adjustments.   Thank you for allowing pharmacy to be a part of this patients care.  Almarie Lunger, PharmD, BCPS, BCIDP Infectious Diseases Clinical Pharmacist 06/14/2024 2:32 PM   **Pharmacist phone directory can now be found on [email or web address] ([email or web address]).  Listed under [facility].

## 2024-06-15 ENCOUNTER — Encounter (HOSPITAL_COMMUNITY): Admission: AD | Disposition: A | Payer: Self-pay | Source: Home / Self Care | Attending: Orthopedic Surgery

## 2024-06-15 ENCOUNTER — Inpatient Hospital Stay (HOSPITAL_COMMUNITY): Admitting: Anesthesiology

## 2024-06-15 ENCOUNTER — Encounter (HOSPITAL_COMMUNITY): Payer: Self-pay | Admitting: Orthopedic Surgery

## 2024-06-15 DIAGNOSIS — Z5181 Encounter for therapeutic drug level monitoring: Secondary | ICD-10-CM | POA: Diagnosis not present

## 2024-06-15 DIAGNOSIS — L97319 Non-pressure chronic ulcer of right ankle with unspecified severity: Secondary | ICD-10-CM | POA: Diagnosis not present

## 2024-06-15 DIAGNOSIS — N189 Chronic kidney disease, unspecified: Secondary | ICD-10-CM | POA: Diagnosis not present

## 2024-06-15 DIAGNOSIS — S91001A Unspecified open wound, right ankle, initial encounter: Secondary | ICD-10-CM

## 2024-06-15 DIAGNOSIS — B961 Klebsiella pneumoniae [K. pneumoniae] as the cause of diseases classified elsewhere: Secondary | ICD-10-CM

## 2024-06-15 DIAGNOSIS — B965 Pseudomonas (aeruginosa) (mallei) (pseudomallei) as the cause of diseases classified elsewhere: Secondary | ICD-10-CM

## 2024-06-15 LAB — BASIC METABOLIC PANEL WITH GFR
Anion gap: 9 (ref 5–15)
BUN: 14 mg/dL (ref 6–20)
CO2: 24 mmol/L (ref 22–32)
Calcium: 8.9 mg/dL (ref 8.9–10.3)
Chloride: 104 mmol/L (ref 98–111)
Creatinine, Ser: 1.37 mg/dL — ABNORMAL HIGH (ref 0.61–1.24)
GFR, Estimated: 59 mL/min — ABNORMAL LOW (ref >60)
Glucose, Bld: 152 mg/dL — ABNORMAL HIGH (ref 70–99)
Potassium: 3.6 mmol/L (ref 3.5–5.1)
Sodium: 137 mmol/L (ref 135–145)

## 2024-06-15 LAB — CBC
HCT: 38.1 % — ABNORMAL LOW (ref 39.0–52.0)
Hemoglobin: 12.7 g/dL — ABNORMAL LOW (ref 13.0–17.0)
MCH: 27.7 pg (ref 26.0–34.0)
MCHC: 33.3 g/dL (ref 30.0–36.0)
MCV: 83.2 fL (ref 80.0–100.0)
Platelets: 281 K/uL (ref 150–400)
RBC: 4.58 MIL/uL (ref 4.22–5.81)
RDW: 14.1 % (ref 11.5–15.5)
WBC: 12.7 K/uL — ABNORMAL HIGH (ref 4.0–10.5)
nRBC: 0 % (ref 0.0–0.2)

## 2024-06-15 LAB — ACID FAST SMEAR (AFB, MYCOBACTERIA): Acid Fast Smear: NEGATIVE

## 2024-06-15 LAB — SURGICAL PCR SCREEN
MRSA, PCR: NEGATIVE
Staphylococcus aureus: NEGATIVE

## 2024-06-15 LAB — MAGNESIUM: Magnesium: 2 mg/dL (ref 1.7–2.4)

## 2024-06-15 MED ORDER — DEXAMETHASONE SOD PHOSPHATE PF 10 MG/ML IJ SOLN
INTRAMUSCULAR | Status: DC | PRN
  Administered 2024-06-15: 10 mg via INTRAVENOUS

## 2024-06-15 MED ORDER — MIDAZOLAM HCL 2 MG/2ML IJ SOLN
INTRAMUSCULAR | Status: AC
  Filled 2024-06-15: qty 2

## 2024-06-15 MED ORDER — PROPOFOL 10 MG/ML IV BOLUS
INTRAVENOUS | Status: DC | PRN
  Administered 2024-06-15: 200 mg via INTRAVENOUS

## 2024-06-15 MED ORDER — FENTANYL CITRATE (PF) 100 MCG/2ML IJ SOLN
25.0000 ug | INTRAMUSCULAR | Status: DC | PRN
  Administered 2024-06-15 (×2): 50 ug via INTRAVENOUS

## 2024-06-15 MED ORDER — FENTANYL CITRATE (PF) 250 MCG/5ML IJ SOLN
INTRAMUSCULAR | Status: DC | PRN
  Administered 2024-06-15 (×2): 50 ug via INTRAVENOUS

## 2024-06-15 MED ORDER — LACTATED RINGERS IV SOLN: INTRAVENOUS | Status: DC | PRN

## 2024-06-15 MED ORDER — PROPOFOL 500 MG/50ML IV EMUL
INTRAVENOUS | Status: DC | PRN
  Administered 2024-06-15: 150 ug/kg/min via INTRAVENOUS

## 2024-06-15 MED ORDER — LABETALOL HCL 5 MG/ML IV SOLN
INTRAVENOUS | Status: DC | PRN
  Administered 2024-06-15: 5 mg via INTRAVENOUS

## 2024-06-15 MED ORDER — OXYCODONE HCL 5 MG/5ML PO SOLN: 5.0000 mg | Freq: Once | ORAL | Status: DC | PRN

## 2024-06-15 MED ORDER — METRONIDAZOLE 500 MG PO TABS
500.0000 mg | ORAL_TABLET | Freq: Two times a day (BID) | ORAL | Status: DC
  Administered 2024-06-15 – 2024-06-18 (×7): 500 mg via ORAL
  Filled 2024-06-15 (×8): qty 1

## 2024-06-15 MED ORDER — ACETAMINOPHEN 10 MG/ML IV SOLN
INTRAVENOUS | Status: AC
  Filled 2024-06-15: qty 100

## 2024-06-15 MED ORDER — MIDAZOLAM HCL (PF) 2 MG/2ML IJ SOLN
INTRAMUSCULAR | Status: DC | PRN
  Administered 2024-06-15: 2 mg via INTRAVENOUS

## 2024-06-15 MED ORDER — DROPERIDOL 2.5 MG/ML IJ SOLN: 0.6250 mg | Freq: Once | INTRAMUSCULAR | Status: DC | PRN

## 2024-06-15 MED ORDER — ORAL CARE MOUTH RINSE: 15.0000 mL | Freq: Once | OROMUCOSAL | Status: AC

## 2024-06-15 MED ORDER — ONDANSETRON HCL 4 MG/2ML IJ SOLN
INTRAMUSCULAR | Status: DC | PRN
  Administered 2024-06-15: 4 mg via INTRAVENOUS

## 2024-06-15 MED ORDER — OXYCODONE HCL 5 MG PO TABS: 5.0000 mg | ORAL_TABLET | Freq: Once | ORAL | Status: DC | PRN

## 2024-06-15 MED ORDER — OXYCODONE HCL 5 MG PO TABS
10.0000 mg | ORAL_TABLET | ORAL | Status: DC | PRN
  Administered 2024-06-15 – 2024-06-16 (×3): 10 mg via ORAL
  Filled 2024-06-15 (×4): qty 2

## 2024-06-15 MED ORDER — FENTANYL CITRATE (PF) 100 MCG/2ML IJ SOLN
INTRAMUSCULAR | Status: AC
  Filled 2024-06-15: qty 2

## 2024-06-15 MED ORDER — ACETAMINOPHEN 10 MG/ML IV SOLN
1000.0000 mg | Freq: Once | INTRAVENOUS | Status: DC | PRN
  Administered 2024-06-15: 1000 mg via INTRAVENOUS

## 2024-06-15 MED ORDER — CHLORHEXIDINE GLUCONATE 0.12 % MT SOLN: 15.0000 mL | Freq: Once | OROMUCOSAL | Status: AC

## 2024-06-15 MED ORDER — LIDOCAINE 2% (20 MG/ML) 5 ML SYRINGE
INTRAMUSCULAR | Status: DC | PRN
  Administered 2024-06-15: 60 mg via INTRAVENOUS

## 2024-06-15 MED ORDER — LACTATED RINGERS IV SOLN: INTRAVENOUS | Status: DC

## 2024-06-15 MED ADMIN — VASHE WOUND IRRIGATION OPTIME: 34 [oz_av] | NDC 99999080177

## 2024-06-15 MED ADMIN — Chlorhexidine Gluconate Soln 0.12%: 15 mL | OROMUCOSAL | NDC 00121089300

## 2024-06-15 MED FILL — Chlorhexidine Gluconate Soln 0.12%: 15.0000 mL | OROMUCOSAL | Qty: 15 | Status: AC

## 2024-06-15 NOTE — Plan of Care (Signed)

## 2024-06-15 NOTE — Anesthesia Procedure Notes (Signed)
 Procedure Name: LMA Insertion Date/Time: 06/15/2024 12:40 PM  Performed by: Arvell Edsel HERO, CRNAPre-anesthesia Checklist: Patient identified, Emergency Drugs available, Suction available, Patient being monitored and Timeout performed Patient Re-evaluated:Patient Re-evaluated prior to induction Oxygen Delivery Method: Circle system utilized Preoxygenation: Pre-oxygenation with 100% oxygen Induction Type: IV induction LMA: LMA inserted LMA Size: 4.0 Number of attempts: 1 Placement Confirmation: positive ETCO2 and breath sounds checked- equal and bilateral Tube secured with: Tape Dental Injury: Teeth and Oropharynx as per pre-operative assessment

## 2024-06-15 NOTE — Transfer of Care (Signed)
 Immediate Anesthesia Transfer of Care Note  Patient: Brandon Flynn  Procedure(s) Performed: INCISION AND DRAINAGE OF DEEP ABSCESS, ANKLE (Right) APPLICATION, WOUND VAC  Patient Location: PACU  Anesthesia Type:General  Level of Consciousness: drowsy and patient cooperative  Airway & Oxygen Therapy: Patient Spontanous Breathing and Patient connected to face mask oxygen  Post-op Assessment: Report given to RN, Post -op Vital signs reviewed and stable, Patient moving all extremities, and Patient moving all extremities X 4  Post vital signs: Reviewed and stable  Last Vitals:  Vitals Value Taken Time  BP 142/100 06/15/24 13:15  Temp 36.9 C 06/15/24 13:14  Pulse 75 06/15/24 13:15  Resp 18 06/15/24 13:15  SpO2 100 % 06/15/24 13:15  Vitals shown include unfiled device data.  Last Pain:  Vitals:   06/15/24 1314  TempSrc:   PainSc: Asleep      Patients Stated Pain Goal: 2 (06/15/24 0830)  Complications: No notable events documented.

## 2024-06-15 NOTE — TOC Initial Note (Signed)
 Transition of Care [facility]) - Initial/Assessment Note    Patient Details  Name: Brandon Flynn MRN: [id number] Date of Birth: [birth year]  Transition of Care [facility]) CM/SW Contact:    Lauraine FORBES Saa, LCSWA Phone Number: 06/15/2024, 4:31 PM  Clinical Narrative:                  4:31 PM Per chart review, patient resides at home with spouse. Patient has a PCP and insurance. PT recommended patient discharge with DME (RW vs electric scooter). At this time, patient is anticipated to discharge home via family/friend. TOC will continue to follow.    Barriers to Discharge: Continued Medical Work up   Patient Goals and CMS Choice            Expected Discharge Plan and Services   Discharge Planning Services: CM Consult   Living arrangements for the past 2 months: Single Family Home                                      Prior Living Arrangements/Services Living arrangements for the past 2 months: Single Family Home Lives with:: Spouse Patient language and need for interpreter reviewed:: Yes        Need for Family Participation in Patient Care: No (Comment)     Criminal Activity/Legal Involvement Pertinent to Current Situation/Hospitalization: No - Comment as needed  Activities of Daily Living   ADL Screening (condition at time of admission) Independently performs ADLs?: Yes (appropriate for developmental age) Is the patient deaf or have difficulty hearing?: No Does the patient have difficulty seeing, even when wearing glasses/contacts?: No Does the patient have difficulty concentrating, remembering, or making decisions?: No  Permission Sought/Granted Permission sought to share information with : Family Supports Permission granted to share information with : No (Contact information on chart)  Share Information with NAME: Felicia Kachel     Permission granted to share info w Relationship: Spouse  Permission granted to share info w Contact Information:  [phone]  Emotional Assessment       Orientation: : Oriented to Self, Oriented to Place, Oriented to  Time, Oriented to Situation Alcohol / Substance Use: Not Applicable Psych Involvement: No (comment)  Admission diagnosis:  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 [place] [address] [place] [address] Phone: [phone] Fax: [phone]     Social Drivers of Health (SDOH) Social History: SDOH Screenings   Food Insecurity: No Food Insecurity (06/13/2024)  Housing: Low Risk (06/13/2024)  Transportation Needs: No Transportation Needs (06/13/2024)  Utilities: Not At Risk (06/13/2024)  Financial Resource Strain: Low Risk (06/12/2024)   Received from [facility]  Social Connections: Socially Integrated (06/13/2024)  Tobacco Use: Low Risk (06/15/2024)   SDOH Interventions:     Readmission Risk Interventions     No data to display

## 2024-06-15 NOTE — Interval H&P Note (Signed)
 History and Physical Interval Note:  06/15/2024 6:49 AM  Brandon Flynn  has presented today for surgery, with the diagnosis of Right ankle wound.  The various methods of treatment have been discussed with the patient and family. After consideration of risks, benefits and other options for treatment, the patient has consented to  Procedures with comments: INCISION AND DRAINAGE OF DEEP ABSCESS, ANKLE (Right) - IRRIGATION AND DEBRIDEMENT OF RIGHT ANKLE as a surgical intervention.  The patient's history has been reviewed, patient examined, no change in status, stable for surgery.  I have reviewed the patient's chart and labs.  Questions were answered to the patient's satisfaction.     [name] V Andy Allende

## 2024-06-15 NOTE — Op Note (Signed)
 06/15/2024  1:12 PM  PATIENT:  Brandon Flynn    PRE-OPERATIVE DIAGNOSIS:  Right ankle wound  POST-OPERATIVE DIAGNOSIS:  Same  PROCEDURE: Excisional debridement OF DEEP ABSCESS, right ANKLE, with excision skin soft tissue muscle and fascia. APPLICATION WOUND VAC Application Kerecis micro graft 38 cm.  To cover wound surface area of 40 cm  SURGEON:  Jerona LULLA Sage, MD  PHYSICIAN ASSISTANT:None ANESTHESIA:   General  PREOPERATIVE INDICATIONS:  Brandon Flynn is a  58 y.o. male with a diagnosis of Right ankle wound who failed conservative measures and elected for surgical management.    The risks benefits and alternatives were discussed with the patient preoperatively including but not limited to the risks of infection, bleeding, nerve injury, cardiopulmonary complications, the need for revision surgery, among others, and the patient was willing to proceed.  OPERATIVE IMPLANTS:   Implant Name Type Inv. Item Serial No. Manufacturer Lot No. LRB No. Used Action  GRAFT SKIN WND MICRO 38 - [id number] Tissue GRAFT SKIN WND MICRO 38  [employer] [id number] Right 1 Implanted    @ENCIMAGES @  OPERATIVE FINDINGS: Tissue margins were healthy and viable.  OPERATIVE PROCEDURE: Patient brought the operating room and underwent general anesthetic.  After adequate levels anesthesia were obtained patient's right lower extremity was prepped using DuraPrep draped into a sterile field a timeout was called.  A 21 blade knife rondure and Cobb elevator were used to excise skin and soft tissue muscle and fascia from the posterior lateral aspect of the right ankle.  Tissue margins were improved with healthy tissue.  Wound measures 4 x 10 cm.  Wound was irrigated with Vashe.  The wound bed was filled with 38 cm of Kerecis micro graft.  This was covered with a cleanse choice sponge to the shape of the wound and covered with the large peel and place wound VAC secured with Coban.  This had a good  suction fit patient was extubated taken the PACU in stable condition.   DISCHARGE PLANNING:  Antibiotic duration: Continue antibiotics based on culture sensitivities.  Cultures currently showing Klebsiella and Pseudomonas  Weightbearing: Minimize weightbearing right lower extremity  Pain medication: Opioid pathway  Dressing care/ Wound VAC: Continue wound VAC  Ambulatory devices: Walker or crutches  Discharge to: Home after culture sensitivities identified.  Follow-up: In the office 1 week post operative.

## 2024-06-15 NOTE — Anesthesia Postprocedure Evaluation (Signed)
 Anesthesia Post Note  Patient: Brandon Flynn  Procedure(s) Performed: INCISION AND DRAINAGE OF DEEP ABSCESS, ANKLE (Right) APPLICATION, WOUND VAC     Patient location during evaluation: PACU Anesthesia Type: General Level of consciousness: awake and alert Pain management: pain level controlled Vital Signs Assessment: post-procedure vital signs reviewed and stable Respiratory status: spontaneous breathing, nonlabored ventilation, respiratory function stable and patient connected to nasal cannula oxygen Cardiovascular status: blood pressure returned to baseline and stable Postop Assessment: no apparent nausea or vomiting Anesthetic complications: no   No notable events documented.  Last Vitals:  Vitals:   06/15/24 1403 06/15/24 1546  BP: (!) 175/97 (!) 175/109  Pulse: 70 81  Resp: 16   Temp: 36.7 C 36.8 C  SpO2: 99% 98%    Last Pain:  Vitals:   06/15/24 1546  TempSrc: Oral  PainSc:                  Taiya Nutting D Lauranne Beyersdorf

## 2024-06-15 NOTE — Progress Notes (Signed)
 .htc PROGRESS NOTE    Brandon Flynn  FMW:[id number] DOB: 05/09/1966 DOA: 06/13/2024 PCP: [name], MD  Subjective: No acute events overnight. Seen and examined at bedside. No new complaints. Awaiting surgical procedure today. Denies nausea, vomiting, constipation, headaches.   Hospital Course:   58 y.o. male with past medical history of hypertension presents with a foot wound.  He is accompanied by his wife.   The foot wound began in August as a small lesion and became infected after a visit to the beach. Initial treatment attempts were unsuccessful, leading to a referral to a specialist. The wound was caused by a tick bite, which allowed bacteria from the beach water to enter, resulting in necrosis.  He was taken for debridement of the yesterday by Dr. Harden.  Cultures growing gram-positive cocci and gram-positive rod   He has a history of hypertension, currently managed with amlodipine 5 mg. His blood pressure has been recorded as high as over 200/100 mmHg during stressful situations, but typically ranges in the 160s to 170s. He admits to being 'bad about drinking water,' which may impact his hydration status and potentially his kidney function.  Assessment and Plan:  Skin ulcer of the right ankle Patient postop day 1 from recent debridement by Dr. Harden and currently has a wound VAC in place.  Prior cultures from 11/14 grew Staphylococcus epidermidis (MRSE) and dipthroids (corynebacterium species).  ID was consulted and patient was started on empiric antibiotics of daptomycin  and cefepime. - repeat deep wound cultures 12/10 showed GPC, GPRs, rare P. aeruginosa - Continue antibiotics per ID - Per orthopedics   Hypertensive urgency Blood pressures 203/121 here in the hospital.  Patient had been on amlodipine 5 mg daily and had recently talked to the primary care provider to start valsartan 160 mg which prescription has been sent to his pharmacy.  They had discussed plans  for starting him on a combination medication in the near future. - cont amlodipine 10 mg daily - Cont irbesartan (pharmacy substitution for valsartan 160 mg which patient had plan to start in outpatient setting with primary care provider) - Hydralazine  IV as needed for elevated blood pressures - Ultimately at discharge patient should follow-up with a primary care provider for final adjustment of medications.   TRH will continue to follow the patient.  DVT prophylaxis: SCDs Start: 06/13/24 1551  SCDs   Code Status: Full Code Family Communication: updated wife at bedside Disposition Plan: TBD, as per primary team  Objective: Vitals:   06/15/24 1330 06/15/24 1344 06/15/24 1345 06/15/24 1403  BP: (!) 158/90  (!) 167/98 (!) 175/97  Pulse: 68 68 71 70  Resp: 10 13 14 16   Temp:   98.4 F (36.9 C) 98.1 F (36.7 C)  TempSrc:    Oral  SpO2: 93% 100% 100% 99%  Weight:      Height:        Intake/Output Summary (Last 24 hours) at 06/15/2024 1508 Last data filed at 06/15/2024 1301 Gross per 24 hour  Intake 724.91 ml  Output 550 ml  Net 174.91 ml   Filed Weights   06/13/24 1126 06/13/24 1654 06/15/24 1007  Weight: 93 kg 97.5 kg 97.5 kg    Examination:  Physical Exam Vitals and nursing note reviewed.  Constitutional:      General: He is not in acute distress.    Appearance: He is ill-appearing.  HENT:     Head: Normocephalic and atraumatic.  Cardiovascular:     Rate and  Rhythm: Normal rate and regular rhythm.     Pulses: Normal pulses.     Heart sounds: Normal heart sounds.  Pulmonary:     Effort: Pulmonary effort is normal.     Breath sounds: Normal breath sounds.  Abdominal:     General: Bowel sounds are normal.     Palpations: Abdomen is soft.  Neurological:     Mental Status: He is alert.     Data Reviewed: I have personally reviewed following labs and imaging studies  CBC: Recent Labs  Lab 06/13/24 1114 06/14/24 0430 06/15/24 0350  WBC 13.4* 11.7* 12.7*   NEUTROABS 9.5*  --   --   HGB 14.0 12.0* 12.7*  HCT 42.2 36.2* 38.1*  MCV 83.7 82.6 83.2  PLT 298 264 281   Basic Metabolic Panel: Recent Labs  Lab 06/13/24 1114 06/14/24 0430  NA 139 138  K 3.3* 3.1*  CL 104 103  CO2 21* 29  GLUCOSE 127* 93  BUN 13 12  CREATININE 1.43* 1.34*  CALCIUM 9.2 8.3*   GFR: Estimated Creatinine Clearance: 69.5 mL/min (A) (by C-G formula based on SCr of 1.34 mg/dL (H)). Liver Function Tests: Recent Labs  Lab 06/13/24 1114  AST 28  ALT 38  ALKPHOS 47  BILITOT 0.9  PROT 7.3  ALBUMIN 4.2   No results for input(s): LIPASE, AMYLASE in the last 168 hours. No results for input(s): AMMONIA in the last 168 hours. Coagulation Profile: No results for input(s): INR, PROTIME in the last 168 hours. Cardiac Enzymes: Recent Labs  Lab 06/14/24 0430  CKTOTAL 170   ProBNP, BNP (last 5 results) No results for input(s): PROBNP, BNP in the last 8760 hours. HbA1C: No results for input(s): HGBA1C in the last 72 hours. CBG: No results for input(s): GLUCAP in the last 168 hours. Lipid Profile: No results for input(s): CHOL, HDL, LDLCALC, TRIG, CHOLHDL, LDLDIRECT in the last 72 hours. Thyroid Function Tests: No results for input(s): TSH, T4TOTAL, FREET4, T3FREE, THYROIDAB in the last 72 hours. Anemia Panel: No results for input(s): VITAMINB12, FOLATE, FERRITIN, TIBC, IRON, RETICCTPCT in the last 72 hours. Sepsis Labs: No results for input(s): PROCALCITON, LATICACIDVEN in the last 168 hours.  Recent Results (from the past 240 hours)  Aerobic/Anaerobic Culture w Gram Stain (surgical/deep wound)     Status: None (Preliminary result)   Collection Time: 06/13/24  2:23 PM   Specimen: Soft Tissue, Other  Result Value Ref Range Status   Specimen Description TISSUE  Final   Special Requests STAT RIGHT ANKLE CULTURE  Final   Gram Stain   Final    RARE WBC PRESENT,BOTH PMN AND MONONUCLEAR RARE GRAM  POSITIVE COCCI IN PAIRS    Culture   Final    FEW PSEUDOMONAS AERUGINOSA CULTURE REINCUBATED FOR BETTER GROWTH RARE KLEBSIELLA AEROGENES SUSCEPTIBILITIES TO FOLLOW RARE BACTEROIDES SPECIES NOT FRAGILIS BETA LACTAMASE POSITIVE Performed at [facility], [address] [place], [place] [address]    Report Status PENDING  Incomplete  Aerobic/Anaerobic Culture w Gram Stain (surgical/deep wound)     Status: None (Preliminary result)   Collection Time: 06/13/24  2:24 PM   Specimen: Soft Tissue, Other  Result Value Ref Range Status   Specimen Description TISSUE RIGHT ANKLE  Final   Special Requests PT ON VANC, TOBRAMYCIN   Final   Gram Stain   Final    RARE WBC PRESENT, PREDOMINANTLY PMN FEW GRAM POSITIVE COCCI FEW GRAM POSITIVE RODS Performed at [facility], [address], [place], [place] [address]    Culture   Final    RARE PSEUDOMONAS AERUGINOSA CULTURE REINCUBATED FOR BETTER GROWTH NO ANAEROBES ISOLATED; CULTURE IN PROGRESS FOR 5 DAYS    Report Status PENDING  Incomplete  Surgical pcr screen     Status: None   Collection Time: 06/15/24 11:01 AM   Specimen: Nasal Mucosa; Nasal Swab  Result Value Ref Range Status   MRSA, PCR NEGATIVE NEGATIVE Final   Staphylococcus aureus NEGATIVE NEGATIVE Final    Comment: (NOTE) The Xpert SA Assay (FDA approved for NASAL specimens in patients 32 years of age and older), is one component of a comprehensive surveillance program. It is not intended to diagnose infection nor to guide or monitor treatment. Performed at [facility], [address] [place], [place] [address]      Radiology Studies: No results found.  Scheduled Meds:  amLODipine  10 mg Oral Daily   vitamin C  1,000 mg Oral Daily   aspirin  EC  81 mg Oral Daily   docusate sodium   100 mg Oral BID   irbesartan  150 mg Oral Daily   metroNIDAZOLE  500 mg Oral Q12H   Continuous Infusions:  ceFEPime (MAXIPIME) IV 2 g (06/15/24 1503)   DAPTOmycin  Stopped  (06/14/24 1633)     LOS: 2 days   Norval Bar, MD  [facility]  06/15/2024, 3:08 PM

## 2024-06-15 NOTE — Anesthesia Preprocedure Evaluation (Addendum)
 Anesthesia Evaluation  Patient identified by MRN, date of birth, ID band Patient awake    Reviewed: Allergy & Precautions, NPO status , Patient's Chart, lab work & pertinent test results  Airway Mallampati: II  TM Distance: >3 FB Neck ROM: Full    Dental  (+) Teeth Intact, Dental Advisory Given   Pulmonary neg pulmonary ROS   breath sounds clear to auscultation       Cardiovascular hypertension, Pt. on medications  Rhythm:Regular Rate:Normal     Neuro/Psych negative neurological ROS  negative psych ROS   GI/Hepatic negative GI ROS, Neg liver ROS,,,  Endo/Other  negative endocrine ROS    Renal/GU negative Renal ROS     Musculoskeletal negative musculoskeletal ROS (+)    Abdominal   Peds  Hematology negative hematology ROS (+)   Anesthesia Other Findings   Reproductive/Obstetrics                              Anesthesia Physical Anesthesia Plan  ASA: 2  Anesthesia Plan: General   Post-op Pain Management: Tylenol  PO (pre-op)* and Toradol IV (intra-op)*   Induction: Intravenous  PONV Risk Score and Plan: 3 and Ondansetron , Dexamethasone, Midazolam  and TIVA  Airway Management Planned: LMA  Additional Equipment: None  Intra-op Plan:   Post-operative Plan: Extubation in OR  Informed Consent: I have reviewed the patients History and Physical, chart, labs and discussed the procedure including the risks, benefits and alternatives for the proposed anesthesia with the patient or authorized representative who has indicated his/her understanding and acceptance.     Dental advisory given  Plan Discussed with: CRNA  Anesthesia Plan Comments:          Anesthesia Quick Evaluation

## 2024-06-16 DIAGNOSIS — S90512A Abrasion, left ankle, initial encounter: Secondary | ICD-10-CM | POA: Diagnosis not present

## 2024-06-16 DIAGNOSIS — L089 Local infection of the skin and subcutaneous tissue, unspecified: Secondary | ICD-10-CM | POA: Diagnosis not present

## 2024-06-16 DIAGNOSIS — B965 Pseudomonas (aeruginosa) (mallei) (pseudomallei) as the cause of diseases classified elsewhere: Secondary | ICD-10-CM | POA: Diagnosis not present

## 2024-06-16 DIAGNOSIS — B961 Klebsiella pneumoniae [K. pneumoniae] as the cause of diseases classified elsewhere: Secondary | ICD-10-CM | POA: Diagnosis not present

## 2024-06-16 MED ORDER — ORAL CARE MOUTH RINSE: 15.0000 mL | OROMUCOSAL | Status: DC | PRN

## 2024-06-16 NOTE — Hospital Course (Signed)
 58 y.o. male with past medical history of hypertension presents with a foot wound.  He is accompanied by his wife.   The foot wound began in August as a small lesion and became infected after a visit to the beach. Initial treatment attempts were unsuccessful, leading to a referral to a specialist. The wound was caused by a tick bite, which allowed bacteria from the beach water to enter, resulting in necrosis.  He was taken for debridement of the yesterday by Dr. Harden.  Cultures growing gram-positive cocci and gram-positive rod   He has a history of hypertension, currently managed with amlodipine  5 mg. His blood pressure has been recorded as high as over 200/100 mmHg during stressful situations, but typically ranges in the 160s to 170s. He admits to being 'bad about drinking water,' which may impact his hydration status and potentially his kidney function.

## 2024-06-16 NOTE — Progress Notes (Signed)
 Physical Therapy Treatment Patient Details Name: Brandon Flynn MRN: [id number] DOB: 1966/06/30 Today's Date: 06/16/2024   History of Present Illness Pt is a 58 y.o. male admitted 06/13/24 with nonhealing R ankle wound. S/p R ankle I&D 12/10; repeat I&D with wound vac placement 12/12. PMH includes I&D R ankle (05/18/24), HTN.    PT Comments  Pt progressing with mobility. Today's session focused on transfer and gait training with knee scooter, pt moving well, only requiring assist for line management; wife present and supportive. All education completed, including HEP provided for BLE strengthening (open chain exercises for RLE); pt reports no further questions or concerns. No further acute PT needs identified, will d/c PT.     If plan is discharge home, recommend the following: A little help with bathing/dressing/bathroom;Assistance with cooking/housework   Can travel by private vehicle      Yes  Equipment Recommendations  Other (knee scooter - pt aware this is likely something he has to purchase on his own)    Recommendations for Other Services       Precautions / Restrictions Precautions Precautions: Other (comment) Recall of Precautions/Restrictions: Intact Precaution/Restrictions Comments: wound vac RLE Restrictions Weight Bearing Restrictions Per Provider Order: Yes RLE Weight Bearing Per Provider Order: Non weight bearing     Mobility  Bed Mobility Overal bed mobility: Modified Independent Bed Mobility: Supine to Sit, Sit to Supine                Transfers Overall transfer level: Modified independent Equipment used: None Transfers: Sit to/from Stand, Bed to chair/wheelchair/BSC Sit to Stand: Modified independent (Device/Increase time) Stand pivot transfers: Modified independent (Device/Increase time)         General transfer comment: stand pivot from bed<>knee scooter mod indep (assist for line management)    Ambulation/Gait Ambulation/Gait  assistance: Supervision   Assistive device: Rolling walker (2 wheels), Knee scooter         General Gait Details: pt just walked back from bathroom with RW, good ability to keep RLE NWB; declines additional gait training and wants to trial knee scooter; able to go 600+' on knee scooter, educ on technique, only requiring assist for line management   Stairs Stairs:  (pt declined need for stair training, reports no concerns ascending/descending steps with crutches like he has been doing the past few weeks)           Wheelchair Mobility     Tilt Bed    Modified Rankin (Stroke Patients Only)       Balance Overall balance assessment: Needs assistance   Sitting balance-Leahy Scale: Good       Standing balance-Leahy Scale: Fair                              Hotel Manager: No apparent difficulties  Cognition Arousal: Alert Behavior During Therapy: WFL for tasks assessed/performed   PT - Cognitive impairments: No apparent impairments                         Following commands: Intact      Cueing Cueing Techniques: Verbal cues  Exercises Other Exercises Other Exercises: [employer] HEP handout provided - BLE SLR, BLE sidelying hip abd, bilateral adductor squeeze, LLE single leg bridge (with RLE held up), single leg sit to stands on LLE    General Comments General comments (skin integrity, edema, etc.): pt's wife present and supportive; they are  familiar with portable wound vac management from prior R ankle sx (05/2024). reviewed educ re: precautions, positioning, edema control, therex/AROM (HEP provided), DME use, activity recommendations, d/c needs. pt and wife report no further questions or concerns.      Pertinent Vitals/Pain Pain Assessment Pain Assessment: Faces Faces Pain Scale: Hurts little more Pain Location: R foot/ankle Pain Descriptors / Indicators: Sore, Tender Pain Intervention(s): Monitored during  session, Limited activity within patient's tolerance, Repositioned    Home Living                          Prior Function            PT Goals (current goals can now be found in the care plan section) Progress towards PT goals: Goals met/education completed, patient discharged from PT    Frequency    Min 2X/week      PT Plan      Co-evaluation              AM-PAC PT 6 Clicks Mobility   Outcome Measure  Help needed turning from your back to your side while in a flat bed without using bedrails?: None Help needed moving from lying on your back to sitting on the side of a flat bed without using bedrails?: None Help needed moving to and from a bed to a chair (including a wheelchair)?: None Help needed standing up from a chair using your arms (e.g., wheelchair or bedside chair)?: None Help needed to walk in hospital room?: A Little Help needed climbing 3-5 steps with a railing? : A Little 6 Click Score: 22    End of Session   Activity Tolerance: Patient tolerated treatment well Patient left: in bed;with call bell/phone within reach;with family/visitor present (received without bed alarm on, has been getting up to bathroom with wife assist) Nurse Communication: Mobility status PT Visit Diagnosis: Difficulty in walking, not elsewhere classified (R26.2);Pain Pain - Right/Left: Right Pain - part of body: Ankle and joints of foot     Time: 0821-0847 PT Time Calculation (min) (ACUTE ONLY): 26 min  Charges:    $Gait Training: 8-22 mins $Therapeutic Activity: 8-22 mins PT General Charges $$ ACUTE PT VISIT: 1 Visit                     Darice Almas, PT, DPT Acute Rehabilitation Services  Personal: Secure Chat Rehab Office: [phone]  Darice LITTIE Almas 06/16/2024, 9:24 AM

## 2024-06-16 NOTE — Progress Notes (Signed)
 PROGRESS NOTE    Brandon Flynn  FMW:[id number] DOB: 1965-08-03 DOA: 06/13/2024 PCP: [name], MD  Subjective: No acute events overnight. Seen and examined at bedside. Reports feeling well . Tolerated I&D procedure well yesterday. Pain controlled. Tolerating oral intake without n/v. Denies constipation.   Hospital Course:  58 y.o. male with past medical history of hypertension presents with a foot wound.  He is accompanied by his wife.   The foot wound began in August as a small lesion and became infected after a visit to the beach. Initial treatment attempts were unsuccessful, leading to a referral to a specialist. The wound was caused by a tick bite, which allowed bacteria from the beach water to enter, resulting in necrosis.  He was taken for debridement of the yesterday by Dr. Harden.  Cultures growing gram-positive cocci and gram-positive rod.   He has a history of hypertension, currently managed with amlodipine  5 mg. His blood pressure has been recorded as high as over 200/100 mmHg during stressful situations, but typically ranges in the 160s to 170s. He admits to being 'bad about drinking water,' which may impact his hydration status and potentially his kidney function.   Assessment and Plan:  Skin ulcer of the right ankle Patient postop day 1 from recent debridement by Dr. Harden and currently has a wound VAC in place.  Prior cultures from 11/14 grew Staphylococcus epidermidis (MRSE) and dipthroids (corynebacterium species).  - now daptomycin  and cefepime . - repeat deep wound cultures 12/10 showed GPC, GPRs, rare P. aeruginosa - Continue antibiotics per ID - Per orthopedics - ID following    Hypertensive urgency Blood pressures 203/121 here in the hospital.  Patient had been on amlodipine  5 mg daily and had recently talked to the primary care provider to start valsartan 160 mg which prescription has been sent to his pharmacy.  They had discussed plans for starting  him on a combination medication in the near future. - cont amlodipine  10 mg daily - Cont irbesartan  (pharmacy substitution for valsartan 160 mg which patient had plan to start in outpatient setting with primary care provider) - Hydralazine  IV as needed for elevated blood pressures - Ultimately at discharge patient should follow-up with a primary care provider for final adjustment of medications.   [facility] will continue to follow the patient.  DVT prophylaxis: SCDs Start: 06/13/24 1551  SCDs   Code Status: Full Code Family Communication: updated wife at bedside Disposition Plan: TBD, as per primary team  Objective: Vitals:   06/15/24 1907 06/15/24 2030 06/16/24 0445 06/16/24 0825  BP: (!) (P) 145/83 139/87 133/77 (!) 148/82  Pulse:  (!) 102 73 87  Resp:  16 17 18   Temp:  98.6 F (37 C) 98 F (36.7 C) 98.2 F (36.8 C)  TempSrc:  Oral Oral Oral  SpO2:  97% 98% 98%  Weight:      Height:        Intake/Output Summary (Last 24 hours) at 06/16/2024 1521 Last data filed at 06/16/2024 1517 Gross per 24 hour  Intake 517.84 ml  Output 225 ml  Net 292.84 ml   Filed Weights   06/13/24 1126 06/13/24 1654 06/15/24 1007  Weight: 93 kg 97.5 kg 97.5 kg    Examination:  Physical Exam Vitals and nursing note reviewed.  Constitutional:      General: He is not in acute distress.    Appearance: He is not ill-appearing.  HENT:     Head: Normocephalic and atraumatic.  Cardiovascular:  Rate and Rhythm: Normal rate and regular rhythm.     Pulses: Normal pulses.     Heart sounds: Normal heart sounds.  Pulmonary:     Effort: Pulmonary effort is normal.     Breath sounds: Normal breath sounds.  Abdominal:     General: Bowel sounds are normal.     Palpations: Abdomen is soft.  Neurological:     Mental Status: He is alert.     Data Reviewed: I have personally reviewed following labs and imaging studies  CBC: Recent Labs  Lab 06/13/24 1114 06/14/24 0430 06/15/24 0350  WBC  13.4* 11.7* 12.7*  NEUTROABS 9.5*  --   --   HGB 14.0 12.0* 12.7*  HCT 42.2 36.2* 38.1*  MCV 83.7 82.6 83.2  PLT 298 264 281   Basic Metabolic Panel: Recent Labs  Lab 06/13/24 1114 06/14/24 0430 06/15/24 1526  NA 139 138 137  K 3.3* 3.1* 3.6  CL 104 103 104  CO2 21* 29 24  GLUCOSE 127* 93 152*  BUN 13 12 14   CREATININE 1.43* 1.34* 1.37*  CALCIUM 9.2 8.3* 8.9  MG  --   --  2.0   GFR: Estimated Creatinine Clearance: 68 mL/min (A) (by C-G formula based on SCr of 1.37 mg/dL (H)). Liver Function Tests: Recent Labs  Lab 06/13/24 1114  AST 28  ALT 38  ALKPHOS 47  BILITOT 0.9  PROT 7.3  ALBUMIN 4.2   No results for input(s): LIPASE, AMYLASE in the last 168 hours. No results for input(s): AMMONIA in the last 168 hours. Coagulation Profile: No results for input(s): INR, PROTIME in the last 168 hours. Cardiac Enzymes: Recent Labs  Lab 06/14/24 0430  CKTOTAL 170   ProBNP, BNP (last 5 results) No results for input(s): PROBNP, BNP in the last 8760 hours. HbA1C: No results for input(s): HGBA1C in the last 72 hours. CBG: No results for input(s): GLUCAP in the last 168 hours. Lipid Profile: No results for input(s): CHOL, HDL, LDLCALC, TRIG, CHOLHDL, LDLDIRECT in the last 72 hours. Thyroid Function Tests: No results for input(s): TSH, T4TOTAL, FREET4, T3FREE, THYROIDAB in the last 72 hours. Anemia Panel: No results for input(s): VITAMINB12, FOLATE, FERRITIN, TIBC, IRON, RETICCTPCT in the last 72 hours. Sepsis Labs: No results for input(s): PROCALCITON, LATICACIDVEN in the last 168 hours.  Recent Results (from the past 240 hours)  Aerobic/Anaerobic Culture w Gram Stain (surgical/deep wound)     Status: None (Preliminary result)   Collection Time: 06/13/24  2:23 PM   Specimen: Soft Tissue, Other  Result Value Ref Range Status   Specimen Description TISSUE  Final   Special Requests STAT RIGHT ANKLE CULTURE  Final    Gram Stain   Final    RARE WBC PRESENT,BOTH PMN AND MONONUCLEAR RARE GRAM POSITIVE COCCI IN PAIRS    Culture   Final    FEW PSEUDOMONAS AERUGINOSA CULTURE REINCUBATED FOR BETTER GROWTH RARE KLEBSIELLA AEROGENES SUSCEPTIBILITIES TO FOLLOW RARE BACTEROIDES SPECIES NOT FRAGILIS BETA LACTAMASE POSITIVE Performed at [facility], [address], [place], [place] [address]    Report Status PENDING  Incomplete  Aerobic/Anaerobic Culture w Gram Stain (surgical/deep wound)     Status: None (Preliminary result)   Collection Time: 06/13/24  2:24 PM   Specimen: Soft Tissue, Other  Result Value Ref Range Status   Specimen Description TISSUE RIGHT ANKLE  Final   Special Requests PT ON VANC, TOBRAMYCIN   Final   Gram Stain   Final    RARE  WBC PRESENT, PREDOMINANTLY PMN FEW GRAM POSITIVE COCCI FEW GRAM POSITIVE RODS Performed at [facility], [address], [place], [place] [address]    Culture   Final    RARE PSEUDOMONAS AERUGINOSA FEW DIPHTHEROIDS(CORYNEBACTERIUM SPECIES) Standardized susceptibility testing for this organism is not available. CULTURE REINCUBATED FOR BETTER GROWTH NO ANAEROBES ISOLATED; CULTURE IN PROGRESS FOR 5 DAYS    Report Status PENDING  Incomplete   Organism ID, Bacteria PSEUDOMONAS AERUGINOSA  Final      Susceptibility   Pseudomonas aeruginosa - MIC*    MEROPENEM <=0.25 SENSITIVE Sensitive     CIPROFLOXACIN  0.5 SENSITIVE Sensitive     IMIPENEM 1 SENSITIVE Sensitive     CEFTAZIDIME/AVIBACTAM 2 SENSITIVE Sensitive     CEFTOLOZANE/TAZOBACTAM 1 SENSITIVE Sensitive     TOBRAMYCIN  <=1 SENSITIVE Sensitive     CEFTAZIDIME 2 SENSITIVE Sensitive     * RARE PSEUDOMONAS AERUGINOSA  Acid Fast Smear (AFB)     Status: None   Collection Time: 06/13/24  2:27 PM   Specimen: Soft Tissue, Other  Result Value Ref Range Status   AFB Specimen Processing Comment  Final    Comment: Tissue Grinding and Digestion/Decontamination   Acid Fast Smear Negative  Final     Comment: (NOTE) Performed At: [facility] [address] [place], [place] [id number] Jennette Shorter MD Ey:[id number]    Source (AFB) TISSUE  Final    Comment: RIGHT ANKLE Performed at [facility], [address], [place], [place] [address]   Surgical pcr screen     Status: None   Collection Time: 06/15/24 11:01 AM   Specimen: Nasal Mucosa; Nasal Swab  Result Value Ref Range Status   MRSA, PCR NEGATIVE NEGATIVE Final   Staphylococcus aureus NEGATIVE NEGATIVE Final    Comment: (NOTE) The Xpert SA Assay (FDA approved for NASAL specimens in patients 41 years of age and older), is one component of a comprehensive surveillance program. It is not intended to diagnose infection nor to guide or monitor treatment. Performed at [facility], [address] [place], [place] [address]      Radiology Studies: No results found.  Scheduled Meds:  amLODipine   10 mg Oral Daily   vitamin C   1,000 mg Oral Daily   aspirin  EC  81 mg Oral Daily   docusate sodium   100 mg Oral BID   irbesartan   150 mg Oral Daily   metroNIDAZOLE   500 mg Oral Q12H   Continuous Infusions:  ceFEPime  (MAXIPIME ) IV 2 g (06/16/24 9367)     LOS: 3 days    Norval Bar, MD  [facility]  06/16/2024, 3:21 PM

## 2024-06-16 NOTE — Progress Notes (Signed)
 Patient ID: Brandon Flynn, male   DOB: Nov 10, 1965, 58 y.o.   MRN: [id number] Patient is status post repeat debridement right ankle.  There is 25 cc in the wound VAC canister.  Culture sensitivities for Pseudomonas and Klebsiella are pending.  Blood pressure improving but still elevated.  Blood pressure 148/82 this morning.  Anticipate discharge on Monday on oral antibiotics.

## 2024-06-16 NOTE — Progress Notes (Signed)
°    [facility]    Date of Admission:  06/13/2024   Total days of antibiotics 3 dapto/cefepime           ID: Brandon Flynn is a 58 y.o. male with   Principal Problem:   Ankle abrasion with infection, left, initial encounter   Medications:   amLODipine   10 mg Oral Daily   vitamin C   1,000 mg Oral Daily   aspirin  EC  81 mg Oral Daily   docusate sodium   100 mg Oral BID   irbesartan   150 mg Oral Daily   metroNIDAZOLE   500 mg Oral Q12H    Objective: Vital signs in last 24 hours: Temp:  [98 F (36.7 C)-98.6 F (37 C)] 98.2 F (36.8 C) (12/13 0825) Pulse Rate:  [68-102] 87 (12/13 0825) Resp:  [10-21] 18 (12/13 0825) BP: (133-175)/(77-109) 148/82 (12/13 0825) SpO2:  [93 %-100 %] 98 % (12/13 0825)   Defer pe  Lab Results Recent Labs    06/14/24 0430 06/15/24 0350 06/15/24 1526  WBC 11.7* 12.7*  --   HGB 12.0* 12.7*  --   HCT 36.2* 38.1*  --   NA 138  --  137  K 3.1*  --  3.6  CL 103  --  104  CO2 29  --  24  BUN 12  --  14  CREATININE 1.34*  --  1.37*   Liver Panel No results for input(s): PROT, ALBUMIN, AST, ALT, ALKPHOS, BILITOT, BILIDIR, IBILI in the last 72 hours. Sedimentation Rate No results for input(s): ESRSEDRATE in the last 72 hours. C-Reactive Protein No results for input(s): CRP in the last 72 hours.  Microbiology:  Studies/Results: No results found.   Assessment/Plan: R ankle wound Cx Bacteroides, Klebsiella, Psuedomonas Sensi on kleb are pending.  Pseudo is pan-sensitive Bacteroides is usually not amenable to sensi testing.  [facility]PC have not speciated.  Will stop dapto  Continue cefepime /flagyl  while we await more data.   [name] Teaching Service/Infectious Disease Pager: [phone]  06/16/2024, 11:18 AM

## 2024-06-17 NOTE — Progress Notes (Signed)
 PROGRESS NOTE    Brandon Flynn  FMW:[id number] DOB: 1966/02/23 DOA: 06/13/2024 PCP: [name], MD  Subjective: No acute events overnight. Seen and examined at bedside. No new complaints. Tolerating oral intake without n/v. Denies constipation.   Hospital Course: 58 y.o. male with past medical history of hypertension presents with a foot wound.  He is accompanied by his wife.   The foot wound began in August as a small lesion and became infected after a visit to the beach. Initial treatment attempts were unsuccessful, leading to a referral to a specialist. The wound was caused by a tick bite, which allowed bacteria from the beach water to enter, resulting in necrosis.  He was taken for debridement of the yesterday by Dr. Harden.  Cultures growing gram-positive cocci and gram-positive rod   He has a history of hypertension, currently managed with amlodipine  5 mg. His blood pressure has been recorded as high as over 200/100 mmHg during stressful situations, but typically ranges in the 160s to 170s. He admits to being 'bad about drinking water,' which may impact his hydration status and potentially his kidney function.   Assessment and Plan:  Skin ulcer of the right ankle Patient postop day 1 from recent debridement by Dr. Harden and currently has a wound VAC in place.  Prior cultures from 11/14 grew Staphylococcus epidermidis (MRSE) and dipthroids (corynebacterium species).  - now metronidazole  and cefepime . - repeat deep wound cultures 12/10 showed GPC, GPRs, rare P. aeruginosa - Continue antibiotics per ID - Per orthopedics - ID following    Hypertensive urgency Blood pressures 203/121 here in the hospital.  Patient had been on amlodipine  5 mg daily and had recently talked to the primary care provider to start valsartan 160 mg which prescription has been sent to his pharmacy.  They had discussed plans for starting him on a combination medication in the near future. - cont  amlodipine  10 mg daily - Cont irbesartan  (pharmacy substitution for valsartan 160 mg which patient had plan to start in outpatient setting with primary care provider) - Hydralazine  IV as needed for elevated blood pressures - Ultimately at discharge patient should follow-up with a primary care provider for final adjustment of medications.   Thank you for the opportunity to care for this patient. [facility] hospitalist team will sign off at this time. Please reach back out again if you need any assistance.  DVT prophylaxis: SCDs Start: 06/13/24 1551  SCDs   Code Status: Full Code Family Communication: updated wife at bedside  Objective: Vitals:   06/16/24 1703 06/16/24 2356 06/17/24 0405 06/17/24 0800  BP: (!) 154/84 (!) 141/79 (!) 151/73 (!) 165/97  Pulse: 84 81 85 76  Resp: 18 18 18    Temp: 99.2 F (37.3 C) 98.9 F (37.2 C) 99.1 F (37.3 C) 98.3 F (36.8 C)  TempSrc: Oral Oral Oral   SpO2: 98% 98% 98% 98%  Weight:      Height:       No intake or output data in the 24 hours ending 06/17/24 1527 Filed Weights   06/13/24 1126 06/13/24 1654 06/15/24 1007  Weight: 93 kg 97.5 kg 97.5 kg    Examination:  Physical Exam Vitals and nursing note reviewed.  Constitutional:      General: He is not in acute distress.    Appearance: He is not ill-appearing.  HENT:     Head: Normocephalic and atraumatic.  Cardiovascular:     Rate and Rhythm: Normal rate and regular rhythm.  Pulses: Normal pulses.     Heart sounds: Normal heart sounds.  Pulmonary:     Effort: Pulmonary effort is normal.     Breath sounds: Normal breath sounds.  Abdominal:     General: Bowel sounds are normal.     Palpations: Abdomen is soft.  Neurological:     Mental Status: He is alert.     Data Reviewed: I have personally reviewed following labs and imaging studies  CBC: Recent Labs  Lab 06/13/24 1114 06/14/24 0430 06/15/24 0350  WBC 13.4* 11.7* 12.7*  NEUTROABS 9.5*  --   --   HGB 14.0 12.0* 12.7*   HCT 42.2 36.2* 38.1*  MCV 83.7 82.6 83.2  PLT 298 264 281   Basic Metabolic Panel: Recent Labs  Lab 06/13/24 1114 06/14/24 0430 06/15/24 1526  NA 139 138 137  K 3.3* 3.1* 3.6  CL 104 103 104  CO2 21* 29 24  GLUCOSE 127* 93 152*  BUN 13 12 14   CREATININE 1.43* 1.34* 1.37*  CALCIUM 9.2 8.3* 8.9  MG  --   --  2.0   GFR: Estimated Creatinine Clearance: 68 mL/min (A) (by C-G formula based on SCr of 1.37 mg/dL (H)). Liver Function Tests: Recent Labs  Lab 06/13/24 1114  AST 28  ALT 38  ALKPHOS 47  BILITOT 0.9  PROT 7.3  ALBUMIN 4.2   No results for input(s): LIPASE, AMYLASE in the last 168 hours. No results for input(s): AMMONIA in the last 168 hours. Coagulation Profile: No results for input(s): INR, PROTIME in the last 168 hours. Cardiac Enzymes: Recent Labs  Lab 06/14/24 0430  CKTOTAL 170   ProBNP, BNP (last 5 results) No results for input(s): PROBNP, BNP in the last 8760 hours. HbA1C: No results for input(s): HGBA1C in the last 72 hours. CBG: No results for input(s): GLUCAP in the last 168 hours. Lipid Profile: No results for input(s): CHOL, HDL, LDLCALC, TRIG, CHOLHDL, LDLDIRECT in the last 72 hours. Thyroid Function Tests: No results for input(s): TSH, T4TOTAL, FREET4, T3FREE, THYROIDAB in the last 72 hours. Anemia Panel: No results for input(s): VITAMINB12, FOLATE, FERRITIN, TIBC, IRON, RETICCTPCT in the last 72 hours. Sepsis Labs: No results for input(s): PROCALCITON, LATICACIDVEN in the last 168 hours.  Recent Results (from the past 240 hours)  Aerobic/Anaerobic Culture w Gram Stain (surgical/deep wound)     Status: None (Preliminary result)   Collection Time: 06/13/24  2:23 PM   Specimen: Soft Tissue, Other  Result Value Ref Range Status   Specimen Description TISSUE  Final   Special Requests STAT RIGHT ANKLE CULTURE  Final   Gram Stain   Final    RARE WBC PRESENT,BOTH PMN AND  MONONUCLEAR RARE GRAM POSITIVE COCCI IN PAIRS    Culture   Final    FEW PSEUDOMONAS AERUGINOSA SUSCEPTIBILITIES PERFORMED ON PREVIOUS CULTURE WITHIN THE LAST 5 DAYS. RARE KLEBSIELLA AEROGENES RARE BACTEROIDES SPECIES NOT FRAGILIS BETA LACTAMASE POSITIVE MODERATE DIPHTHEROIDS(CORYNEBACTERIUM SPECIES) Standardized susceptibility testing for this organism is not available. Performed at [facility], [address] [place], [place] [address]    Report Status PENDING  Incomplete   Organism ID, Bacteria KLEBSIELLA AEROGENES  Final      Susceptibility   Klebsiella aerogenes - MIC*    CEFEPIME  <=0.12 SENSITIVE Sensitive     ERTAPENEM <=0.12 SENSITIVE Sensitive     CEFTRIAXONE <=0.25 SENSITIVE Sensitive     CIPROFLOXACIN  <=0.06 SENSITIVE Sensitive     GENTAMICIN <=1 SENSITIVE Sensitive  MEROPENEM <=0.25 SENSITIVE Sensitive     TRIMETH/SULFA <=20 SENSITIVE Sensitive     PIP/TAZO Value in next row Sensitive      <=4 SENSITIVEThis is a modified FDA-approved test that has been validated and its performance characteristics determined by the reporting laboratory.  This laboratory is certified under the Clinical Laboratory Improvement Amendments CLIA as qualified to perform high complexity clinical laboratory testing.    * RARE KLEBSIELLA AEROGENES  Aerobic/Anaerobic Culture w Gram Stain (surgical/deep wound)     Status: None (Preliminary result)   Collection Time: 06/13/24  2:24 PM   Specimen: Soft Tissue, Other  Result Value Ref Range Status   Specimen Description TISSUE RIGHT ANKLE  Final   Special Requests PT ON VANC, TOBRAMYCIN   Final   Gram Stain   Final    RARE WBC PRESENT, PREDOMINANTLY PMN FEW GRAM POSITIVE COCCI FEW GRAM POSITIVE RODS Performed at [facility], [address], [place], [place] [address]    Culture   Final    RARE PSEUDOMONAS AERUGINOSA FEW DIPHTHEROIDS(CORYNEBACTERIUM SPECIES) Standardized susceptibility testing for this organism is not  available. NO ANAEROBES ISOLATED; CULTURE IN PROGRESS FOR 5 DAYS    Report Status PENDING  Incomplete   Organism ID, Bacteria PSEUDOMONAS AERUGINOSA  Final      Susceptibility   Pseudomonas aeruginosa - MIC*    MEROPENEM <=0.25 SENSITIVE Sensitive     CIPROFLOXACIN  0.5 SENSITIVE Sensitive     IMIPENEM 1 SENSITIVE Sensitive     CEFTAZIDIME/AVIBACTAM 2 SENSITIVE Sensitive     CEFTOLOZANE/TAZOBACTAM 1 SENSITIVE Sensitive     TOBRAMYCIN  <=1 SENSITIVE Sensitive     CEFTAZIDIME 2 SENSITIVE Sensitive     * RARE PSEUDOMONAS AERUGINOSA  Acid Fast Smear (AFB)     Status: None   Collection Time: 06/13/24  2:27 PM   Specimen: Soft Tissue, Other  Result Value Ref Range Status   AFB Specimen Processing Comment  Final    Comment: Tissue Grinding and Digestion/Decontamination   Acid Fast Smear Negative  Final    Comment: (NOTE) Performed At: [facility] [address] [place], [place] [id number] Jennette Shorter MD Ey:[id number]    Source (AFB) TISSUE  Final    Comment: RIGHT ANKLE Performed at [facility], [address] [place], [place] [address]   Surgical pcr screen     Status: None   Collection Time: 06/15/24 11:01 AM   Specimen: Nasal Mucosa; Nasal Swab  Result Value Ref Range Status   MRSA, PCR NEGATIVE NEGATIVE Final   Staphylococcus aureus NEGATIVE NEGATIVE Final    Comment: (NOTE) The Xpert SA Assay (FDA approved for NASAL specimens in patients 67 years of age and older), is one component of a comprehensive surveillance program. It is not intended to diagnose infection nor to guide or monitor treatment. Performed at [facility], [address], [place], [place] [address]      Radiology Studies: No results found.  Scheduled Meds:  amLODipine   10 mg Oral Daily   vitamin C   1,000 mg Oral Daily   aspirin  EC  81 mg Oral Daily   docusate sodium   100 mg Oral BID   irbesartan   150 mg Oral Daily   metroNIDAZOLE   500 mg Oral Q12H   Continuous  Infusions:  ceFEPime  (MAXIPIME ) IV 2 g (06/17/24 0649)     LOS: 4 days   Norval Bar, MD  [facility]  06/17/2024, 3:27 PM

## 2024-06-17 NOTE — Plan of Care (Signed)

## 2024-06-17 NOTE — Progress Notes (Signed)
 Patient ID: Brandon Flynn, male   DOB: 04-05-1966, 58 y.o.   MRN: [id number] Patient is status post repeat debridement right ankle.  Doing well.  Per Harden, Anticipate discharge on Monday on oral antibiotics.

## 2024-06-18 ENCOUNTER — Telehealth (HOSPITAL_COMMUNITY): Payer: Self-pay

## 2024-06-18 ENCOUNTER — Other Ambulatory Visit (HOSPITAL_COMMUNITY): Payer: Self-pay

## 2024-06-18 ENCOUNTER — Encounter (HOSPITAL_COMMUNITY): Payer: Self-pay | Admitting: Orthopedic Surgery

## 2024-06-18 MED ORDER — HYDROCODONE-ACETAMINOPHEN 5-325 MG PO TABS
1.0000 | ORAL_TABLET | ORAL | 0 refills | Status: AC | PRN
  Filled 2024-06-18: qty 30, 5d supply, fill #0

## 2024-06-18 MED ORDER — LINEZOLID 600 MG PO TABS
600.0000 mg | ORAL_TABLET | Freq: Two times a day (BID) | ORAL | Status: DC
  Administered 2024-06-18: 13:00:00 600 mg via ORAL
  Filled 2024-06-18: qty 1

## 2024-06-18 MED ORDER — LINEZOLID 600 MG PO TABS
600.0000 mg | ORAL_TABLET | Freq: Two times a day (BID) | ORAL | 0 refills | Status: AC
  Filled 2024-06-18: qty 20, 10d supply, fill #0

## 2024-06-18 MED ORDER — CIPROFLOXACIN HCL 750 MG PO TABS
750.0000 mg | ORAL_TABLET | Freq: Two times a day (BID) | ORAL | Status: DC
  Filled 2024-06-18: qty 1

## 2024-06-18 MED ORDER — METRONIDAZOLE 500 MG PO TABS
500.0000 mg | ORAL_TABLET | Freq: Two times a day (BID) | ORAL | 0 refills | Status: AC
  Filled 2024-06-18: qty 20, 10d supply, fill #0

## 2024-06-18 MED ORDER — CIPROFLOXACIN HCL 750 MG PO TABS
750.0000 mg | ORAL_TABLET | Freq: Two times a day (BID) | ORAL | 0 refills | Status: AC
  Filled 2024-06-18: qty 20, 10d supply, fill #0

## 2024-06-18 NOTE — Progress Notes (Signed)
 Patient ID: Brandon Flynn, male   DOB: June 26, 1966, 58 y.o.   MRN: [id number] Patient is status post debridement left ankle ulcer.  Cultures are showing Klebsiella and Pseudomonas that are both pansensitive.  There is 25 cc in the wound VAC canister.  Anticipate we could discharge to patient home today on oral antibiotics with the Prevena plus wound VAC pump.  I will follow-up in the office on Thursday.

## 2024-06-18 NOTE — Progress Notes (Signed)
 Discharge   Patient expressed verbal understanding of discharge POC.   Patient given time to ask any questions.  Additional education included in AVS.  Alert oriented in good spirits.   PIV removed by staff.  Pressure dressings intact.  Patient has right foot wound vac in boot.  Wife at bedside.   TOC meds ready Discharging to Main A.

## 2024-06-18 NOTE — Progress Notes (Signed)
 Wound vac changed to portable prevena plus.

## 2024-06-18 NOTE — TOC Transition Note (Signed)
 Transition of Care [facility]) - Discharge Note   Patient Details  Name: Brandon Flynn MRN: [id number] Date of Birth: Oct 16, 1965  [facility] CM/SW Contact:  Brandon KANDICE Stain, Brandon Flynn Phone Number: 06/18/2024, 1:26 PM   Clinical Narrative:    Brandon Flynn is stable to discharge home. Follow up apt on AVS. No ICM (Inpatient Care Management) needs at this time.    Final next level of care: Home/Self Care Barriers to Discharge: Barriers Resolved   Patient Goals and CMS Choice Patient states their goals for this hospitalization and ongoing recovery are:: return home          Discharge Placement             Home          Discharge Plan and Services Additional resources added to the After Visit Summary for     Discharge Planning Services: CM Consult                                 Social Drivers of Health (SDOH) Interventions SDOH Screenings   Food Insecurity: No Food Insecurity (06/13/2024)  Housing: Low Risk (06/13/2024)  Transportation Needs: No Transportation Needs (06/13/2024)  Utilities: Not At Risk (06/13/2024)  Financial Resource Strain: Low Risk (06/12/2024)   Received from [facility]  Social Connections: Socially Integrated (06/13/2024)  Tobacco Use: Low Risk (06/15/2024)     Readmission Risk Interventions    06/18/2024    1:26 PM  Readmission Risk Prevention Plan  Post Dischage Appt Complete  Medication Screening Complete  Transportation Screening Complete

## 2024-06-18 NOTE — Telephone Encounter (Signed)
 Pharmacy Patient Advocate Encounter  Insurance verification completed.    The patient is insured through [facility]. Patient has [employer], may use a copay card, and/or apply for patient assistance if available.    Ran test claim for Linezolid  600mg  tablet and the current 30 day co-pay is $0.   This test claim was processed through [employer]- copay amounts may vary at other pharmacies due to [employer], or as the patient moves through the different stages of their insurance plan.

## 2024-06-18 NOTE — Discharge Summary (Signed)
 Physician Discharge Summary  Patient ID: Brandon Flynn MRN: [id number] DOB/AGE: August 16, 1965 58 y.o.  Admit date: 06/13/2024 Discharge date: 06/18/2024  Admission Diagnoses:  Principal Problem:   Ankle abrasion with infection, left, initial encounter   Discharge Diagnoses:  Same  Past Medical History:  Diagnosis Date   Hypertension     Surgeries: Procedures: INCISION AND DRAINAGE OF DEEP ABSCESS, ANKLE APPLICATION, WOUND VAC on 06/15/2024   Consultants:   Discharged Condition: Improved  Hospital Course: Brandon Flynn is an 58 y.o. male who was admitted 06/13/2024 with a chief complaint of No chief complaint on file. , and found to have a diagnosis of Ankle abrasion with infection, left, initial encounter.  They were brought to the operating room on 06/15/2024 and underwent the above named procedures.    They were given perioperative antibiotics:  Anti-infectives (From admission, onward)    Start     Dose/Rate Route Frequency Ordered Stop   06/18/24 2000  ciprofloxacin  (CIPRO ) tablet 750 mg        750 mg Oral 2 times daily 06/18/24 0927     06/18/24 1015  linezolid  (ZYVOX ) tablet 600 mg        600 mg Oral Every 12 hours 06/18/24 0927     06/18/24 0000  ciprofloxacin  (CIPRO ) 750 MG tablet        750 mg Oral 2 times daily 06/18/24 1144 06/28/24 2359   06/18/24 0000  linezolid  (ZYVOX ) 600 MG tablet        600 mg Oral Every 12 hours 06/18/24 1144 06/28/24 2359   06/18/24 0000  metroNIDAZOLE  (FLAGYL ) 500 MG tablet        500 mg Oral Every 12 hours 06/18/24 1144 06/28/24 2359   06/15/24 1345  metroNIDAZOLE  (FLAGYL ) tablet 500 mg        500 mg Oral Every 12 hours 06/15/24 1338     06/14/24 1530  ceFEPIme  (MAXIPIME ) 2 g in sodium chloride  0.9 % 100 mL IVPB  Status:  Discontinued        2 g 200 mL/hr over 30 Minutes Intravenous Every 8 hours 06/14/24 1431 06/18/24 0927   06/14/24 1400  DAPTOmycin  (CUBICIN ) IVPB 700 mg/100mL premix  Status:  Discontinued        8  mg/kg  93 kg 200 mL/hr over 30 Minutes Intravenous Daily 06/14/24 0757 06/16/24 1122   06/13/24 1715  DAPTOmycin  (CUBICIN ) 350 mg in sodium chloride  0.9 % IVPB  Status:  Discontinued        4 mg/kg  93 kg 114 mL/hr over 30 Minutes Intravenous Daily 06/13/24 1621 06/14/24 0757   06/13/24 1334  tobramycin  (NEBCIN ) powder  Status:  Discontinued          As needed 06/13/24 1336 06/13/24 1444   06/13/24 1329  vancomycin  (VANCOCIN ) powder  Status:  Discontinued          As needed 06/13/24 1331 06/13/24 1444     .  They were given compression stockings, early ambulation, and chemoprophylaxis for DVT prophylaxis.  They benefited maximally from their hospital stay and there were no complications.    Recent vital signs:  Vitals:   06/18/24 0600 06/18/24 0807  BP: (!) 145/91 (!) 175/103  Pulse: 70 78  Resp:  18  Temp: 98.1 F (36.7 C) 98.1 F (36.7 C)  SpO2: 99% 99%    Recent laboratory studies:  Results for orders placed or performed during the hospital encounter of 06/13/24  CBC WITH DIFFERENTIAL   Collection  Time: 06/13/24 11:14 AM  Result Value Ref Range   WBC 13.4 (H) 4.0 - 10.5 K/uL   RBC 5.04 4.22 - 5.81 MIL/uL   Hemoglobin 14.0 13.0 - 17.0 g/dL   HCT 57.7 60.9 - 47.9 %   MCV 83.7 80.0 - 100.0 fL   MCH 27.8 26.0 - 34.0 pg   MCHC 33.2 30.0 - 36.0 g/dL   RDW 86.0 88.4 - 84.4 %   Platelets 298 150 - 400 K/uL   nRBC 0.0 0.0 - 0.2 %   Neutrophils Relative % 71 %   Neutro Abs 9.5 (H) 1.7 - 7.7 K/uL   Lymphocytes Relative 24 %   Lymphs Abs 3.2 0.7 - 4.0 K/uL   Monocytes Relative 5 %   Monocytes Absolute 0.6 0.1 - 1.0 K/uL   Eosinophils Relative 0 %   Eosinophils Absolute 0.0 0.0 - 0.5 K/uL   Basophils Relative 0 %   Basophils Absolute 0.1 0.0 - 0.1 K/uL   Immature Granulocytes 0 %   Abs Immature Granulocytes 0.03 0.00 - 0.07 K/uL  Comprehensive metabolic panel   Collection Time: 06/13/24 11:14 AM  Result Value Ref Range   Sodium 139 135 - 145 mmol/L   Potassium 3.3  (L) 3.5 - 5.1 mmol/L   Chloride 104 98 - 111 mmol/L   CO2 21 (L) 22 - 32 mmol/L   Glucose, Bld 127 (H) 70 - 99 mg/dL   BUN 13 6 - 20 mg/dL   Creatinine, Ser 8.56 (H) 0.61 - 1.24 mg/dL   Calcium 9.2 8.9 - 89.6 mg/dL   Total Protein 7.3 6.5 - 8.1 g/dL   Albumin 4.2 3.5 - 5.0 g/dL   AST 28 15 - 41 U/L   ALT 38 0 - 44 U/L   Alkaline Phosphatase 47 38 - 126 U/L   Total Bilirubin 0.9 0.0 - 1.2 mg/dL   GFR, Estimated 56 (L) >60 mL/min   Anion gap 14 5 - 15  Aerobic/Anaerobic Culture w Gram Stain (surgical/deep wound)   Collection Time: 06/13/24  2:23 PM   Specimen: Soft Tissue, Other  Result Value Ref Range   Specimen Description TISSUE    Special Requests STAT RIGHT ANKLE CULTURE    Gram Stain      RARE WBC PRESENT,BOTH PMN AND MONONUCLEAR RARE GRAM POSITIVE COCCI IN PAIRS    Culture      FEW PSEUDOMONAS AERUGINOSA SUSCEPTIBILITIES PERFORMED ON PREVIOUS CULTURE WITHIN THE LAST 5 DAYS. RARE KLEBSIELLA AEROGENES RARE BACTEROIDES SPECIES NOT FRAGILIS BETA LACTAMASE POSITIVE MODERATE DIPHTHEROIDS(CORYNEBACTERIUM SPECIES) Standardized susceptibility testing for this organism is not available. Performed at [facility], [address] [place], [place] [address]    Report Status PENDING    Organism ID, Bacteria KLEBSIELLA AEROGENES       Susceptibility   Klebsiella aerogenes - MIC*    CEFEPIME  <=0.12 SENSITIVE Sensitive     ERTAPENEM <=0.12 SENSITIVE Sensitive     CEFTRIAXONE <=0.25 SENSITIVE Sensitive     CIPROFLOXACIN  <=0.06 SENSITIVE Sensitive     GENTAMICIN <=1 SENSITIVE Sensitive     MEROPENEM <=0.25 SENSITIVE Sensitive     TRIMETH/SULFA <=20 SENSITIVE Sensitive     PIP/TAZO Value in next row Sensitive      <=4 SENSITIVEThis is a modified FDA-approved test that has been validated and its performance characteristics determined by the reporting laboratory.  This laboratory is certified under the Clinical Laboratory Improvement Amendments CLIA as qualified to perform high  complexity clinical laboratory testing.    *  RARE KLEBSIELLA AEROGENES  Aerobic/Anaerobic Culture w Gram Stain (surgical/deep wound)   Collection Time: 06/13/24  2:24 PM   Specimen: Soft Tissue, Other  Result Value Ref Range   Specimen Description TISSUE RIGHT ANKLE    Special Requests PT ON VANC, TOBRAMYCIN     Gram Stain      RARE WBC PRESENT, PREDOMINANTLY PMN FEW GRAM POSITIVE COCCI FEW GRAM POSITIVE RODS    Culture      RARE PSEUDOMONAS AERUGINOSA FEW DIPHTHEROIDS(CORYNEBACTERIUM SPECIES) Standardized susceptibility testing for this organism is not available. NO ANAEROBES ISOLATED; CULTURE IN PROGRESS FOR 5 DAYS    Report Status PENDING    Organism ID, Bacteria PSEUDOMONAS AERUGINOSA       Susceptibility   Pseudomonas aeruginosa - MIC*    MEROPENEM <=0.25 SENSITIVE Sensitive     CIPROFLOXACIN  0.5 SENSITIVE Sensitive     IMIPENEM 1 SENSITIVE Sensitive     CEFTAZIDIME/AVIBACTAM 2 SENSITIVE Sensitive     CEFTOLOZANE/TAZOBACTAM 1 SENSITIVE Sensitive     TOBRAMYCIN  <=1 SENSITIVE Sensitive     CEFTAZIDIME 2 SENSITIVE Sensitive     CEFEPIME  Value in next row Sensitive      8 SENSITIVEPerformed at [facility], [address] [place], [place] [address]    * RARE PSEUDOMONAS AERUGINOSA  Fungus Culture With Stain   Collection Time: 06/13/24  2:27 PM   Specimen: Soft Tissue, Other  Result Value Ref Range   Fungus Stain Final report    Fungus (Mycology) Culture PENDING    Fungal Source TISSUE   Acid Fast Smear (AFB)   Collection Time: 06/13/24  2:27 PM   Specimen: Soft Tissue, Other  Result Value Ref Range   AFB Specimen Processing Comment    Acid Fast Smear Negative    Source (AFB) TISSUE   Fungus Culture Result   Collection Time: 06/13/24  2:27 PM  Result Value Ref Range   Result 1 Comment   Salicylate level   Collection Time: 06/13/24  4:06 PM  Result Value Ref Range   Salicylate Lvl <7.0 (L) 7.0 - 30.0 mg/dL  Basic metabolic panel   Collection Time: 06/14/24   4:30 AM  Result Value Ref Range   Sodium 138 135 - 145 mmol/L   Potassium 3.1 (L) 3.5 - 5.1 mmol/L   Chloride 103 98 - 111 mmol/L   CO2 29 22 - 32 mmol/L   Glucose, Bld 93 70 - 99 mg/dL   BUN 12 6 - 20 mg/dL   Creatinine, Ser 8.65 (H) 0.61 - 1.24 mg/dL   Calcium 8.3 (L) 8.9 - 10.3 mg/dL   GFR, Estimated >39 >39 mL/min   Anion gap 6 5 - 15  CBC   Collection Time: 06/14/24  4:30 AM  Result Value Ref Range   WBC 11.7 (H) 4.0 - 10.5 K/uL   RBC 4.38 4.22 - 5.81 MIL/uL   Hemoglobin 12.0 (L) 13.0 - 17.0 g/dL   HCT 63.7 (L) 60.9 - 47.9 %   MCV 82.6 80.0 - 100.0 fL   MCH 27.4 26.0 - 34.0 pg   MCHC 33.1 30.0 - 36.0 g/dL   RDW 85.9 88.4 - 84.4 %   Platelets 264 150 - 400 K/uL   nRBC 0.0 0.0 - 0.2 %  CK   Collection Time: 06/14/24  4:30 AM  Result Value Ref Range   Total CK 170 49 - 397 U/L  CBC   Collection Time: 06/15/24  3:50 AM  Result Value Ref Range   WBC 12.7 (  H) 4.0 - 10.5 K/uL   RBC 4.58 4.22 - 5.81 MIL/uL   Hemoglobin 12.7 (L) 13.0 - 17.0 g/dL   HCT 61.8 (L) 60.9 - 47.9 %   MCV 83.2 80.0 - 100.0 fL   MCH 27.7 26.0 - 34.0 pg   MCHC 33.3 30.0 - 36.0 g/dL   RDW 85.8 88.4 - 84.4 %   Platelets 281 150 - 400 K/uL   nRBC 0.0 0.0 - 0.2 %  Surgical pcr screen   Collection Time: 06/15/24 11:01 AM   Specimen: Nasal Mucosa; Nasal Swab  Result Value Ref Range   MRSA, PCR NEGATIVE NEGATIVE   Staphylococcus aureus NEGATIVE NEGATIVE  Basic metabolic panel   Collection Time: 06/15/24  3:26 PM  Result Value Ref Range   Sodium 137 135 - 145 mmol/L   Potassium 3.6 3.5 - 5.1 mmol/L   Chloride 104 98 - 111 mmol/L   CO2 24 22 - 32 mmol/L   Glucose, Bld 152 (H) 70 - 99 mg/dL   BUN 14 6 - 20 mg/dL   Creatinine, Ser 8.62 (H) 0.61 - 1.24 mg/dL   Calcium 8.9 8.9 - 89.6 mg/dL   GFR, Estimated 59 (L) >60 mL/min   Anion gap 9 5 - 15  Magnesium   Collection Time: 06/15/24  3:26 PM  Result Value Ref Range   Magnesium 2.0 1.7 - 2.4 mg/dL    Discharge Medications:   Allergies as of  06/18/2024       Reactions   Sulfa Antibiotics Hives, Itching        Medication List     TAKE these medications    amLODipine  5 MG tablet Commonly known as: NORVASC  Take 5 mg by mouth daily.   ciprofloxacin  750 MG tablet Commonly known as: CIPRO  Take 1 tablet (750 mg total) by mouth 2 (two) times daily for 10 days.   HYDROcodone -acetaminophen  5-325 MG tablet Commonly known as: NORCO/VICODIN Take 1 tablet by mouth every 4 (four) hours as needed. What changed: reasons to take this   ibuprofen 400 MG tablet Commonly known as: ADVIL Take 400 mg by mouth daily.   linezolid  600 MG tablet Commonly known as: ZYVOX  Take 1 tablet (600 mg total) by mouth every 12 (twelve) hours for 10 days.   metroNIDAZOLE  500 MG tablet Commonly known as: FLAGYL  Take 1 tablet (500 mg total) by mouth every 12 (twelve) hours for 10 days.   vitamin C  1000 MG tablet Take 1,000 mg by mouth daily.        Diagnostic Studies: No results found.  Disposition: Discharge disposition: 01-Home or Self Care       Discharge Instructions     Call MD / Call 911   Complete by: As directed    If you experience chest pain or shortness of breath, CALL 911 and be transported to the hospital emergency room.  If you develope a fever above 101 F, pus (white drainage) or increased drainage or redness at the wound, or calf pain, call your surgeon's office.   Constipation Prevention   Complete by: As directed    Drink plenty of fluids.  Prune juice may be helpful.  You may use a stool softener, such as Colace (over the counter) 100 mg twice a day.  Use MiraLax (over the counter) for constipation as needed.   Increase activity slowly as tolerated   Complete by: As directed    Negative Pressure Wound Therapy - Incisional   Complete by: As directed  Attach the wound VAC dressing to the Prevena plus portable wound VAC pump for discharge   Post-operative opioid taper instructions:   Complete by: As directed     POST-OPERATIVE OPIOID TAPER INSTRUCTIONS: It is important to wean off of your opioid medication as soon as possible. If you do not need pain medication after your surgery it is ok to stop day one. Opioids include: Codeine, Hydrocodone (Norco, Vicodin), Oxycodone (Percocet, oxycontin ) and hydromorphone  amongst others.  Long term and even short term use of opiods can cause: Increased pain response Dependence Constipation Depression Respiratory depression And more.  Withdrawal symptoms can include Flu like symptoms Nausea, vomiting And more Techniques to manage these symptoms Hydrate well Eat regular healthy meals Stay active Use relaxation techniques(deep breathing, meditating, yoga) Do Not substitute Alcohol to help with tapering If you have been on opioids for less than two weeks and do not have pain than it is ok to stop all together.  Plan to wean off of opioids This plan should start within one week post op of your joint replacement. Maintain the same interval or time between taking each dose and first decrease the dose.  Cut the total daily intake of opioids by one tablet each day Next start to increase the time between doses. The last dose that should be eliminated is the evening dose.           Follow-up Information     Harden Jerona GAILS, MD Follow up in 1 week(s).   Specialty: Orthopedic Surgery Contact information: [address] [place] [address] [phone]                  Signed: Jerona GAILS Harden 06/18/2024, 12:42 PM

## 2024-06-19 LAB — AEROBIC/ANAEROBIC CULTURE W GRAM STAIN (SURGICAL/DEEP WOUND)

## 2024-06-19 NOTE — Progress Notes (Signed)
 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 He reports he noticed a tick in his rt feet when he took off his socks which has started to latch and removed immediately ( Reports completing 10 days of doxycycline). He then developed a recurrent scab that would fall off but recur despite using doxycycline and mupirocin. He reports wound might have contaminated [place]. He was given 10 days course of ciprofloxacin  on 05/28/24   11/14 s/p excision debridement of necrotic ulcer of lateral rt ankle. OR cx staph epi, diptheroids   12/10 s/p excisional debridement. OR cx with PsA, Klebsiella aerogenes, Bacteroides spp, still reincubtaing; Gram stain with GPC, GPR   OR findings: Patient had a large hematoma with ischemic skin and soft tissue muscle and fascia.  Ischemic changes possibly secondary to hypertension uncontrolled    12/12 s/p excisional debridement. Tissue margins were healthy and viable. NO cx, vac placed   Recommendations: -d/c cefepime  -Linezolid + metro+ cipro  x 2 weeks form last OR EOT 12/25( viable tissue noted per or note review). 06/13/24 EKG with qtc 477 --F/U with ID on 12/22 -Communicated plan with pRimary  Microbiology:   Antibiotics: Cefepime  lnezolid Cultures: Blood  Urine  Other 06/13/24: right ankle cx 1/2 + bacteroides sp ,  kleb aerogenes, PsA, corynebacterium sp 1/2 PsA, Corynebacterium sp, bacteroides ovatus  SUBJECTIVE: Resting in bed. Wife at bedside Interval: afebrile overnight  Review of Systems: Review of Systems  All other systems reviewed and are negative.    Scheduled Meds: Continuous Infusions: PRN Meds:. Allergies[1]  OBJECTIVE: Vitals:    06/17/24 2100 06/18/24 0600 06/18/24 0807 06/18/24 1423  BP: (!) 163/89 (!) 145/91 (!) 175/103 (!) 155/90  Pulse: 83 70 78   Resp: 17  18 20   Temp: 98.5 F (36.9 C) 98.1 F (36.7 C) 98.1 F (36.7 C)   TempSrc: Oral Oral    SpO2: 97% 99% 99% 99%  Weight:      Height:       Body mass index is 30.84 kg/m.  Physical Exam Constitutional:      General: He is not in acute distress.    Appearance: He is normal weight. He is not toxic-appearing.  HENT:     Head: Normocephalic and atraumatic.     Right Ear: External ear normal.     Left Ear: External ear normal.     Nose: No congestion or rhinorrhea.     Mouth/Throat:     Mouth: Mucous membranes are moist.     Pharynx: Oropharynx is clear.  Eyes:     Extraocular Movements: Extraocular movements intact.     Conjunctiva/sclera: Conjunctivae normal.     Pupils: Pupils are equal, round, and reactive to light.  Cardiovascular:     Rate and Rhythm: Normal rate and regular rhythm.     Heart sounds: No murmur heard.    No friction rub. No gallop.  Pulmonary:     Effort: Pulmonary effort is normal.     Breath sounds: Normal breath sounds.  Abdominal:     General: Abdomen is flat. Bowel sounds are normal.     Palpations: Abdomen is  soft.  Musculoskeletal:        General: No swelling.     Cervical back: Normal range of motion and neck supple.  Skin:    General: Skin is warm and dry.  Neurological:     General: No focal deficit present.     Mental Status: He is oriented to person, place, and time.  Psychiatric:        Mood and Affect: Mood normal.     RLE bandaged  Lab Results Lab Results  Component Value Date   WBC 12.7 (H) 06/15/2024   HGB 12.7 (L) 06/15/2024   HCT 38.1 (L) 06/15/2024   MCV 83.2 06/15/2024   PLT 281 06/15/2024    Lab Results  Component Value Date   CREATININE 1.37 (H) 06/15/2024   BUN 14 06/15/2024   NA 137 06/15/2024   K 3.6 06/15/2024   CL 104 06/15/2024   CO2 24 06/15/2024    Lab Results   Component Value Date   ALT 38 06/13/2024   AST 28 06/13/2024   ALKPHOS 47 06/13/2024   BILITOT 0.9 06/13/2024        Loney Stank, MD [facility] 06/19/2024, 7:30 PM Evaluation of this patient requires complex antimicrobial therapy evaluation and counseling + isolation needs for disease transmission risk assessment and mitigation      [1]  Allergies Allergen Reactions   Sulfa Antibiotics Hives and Itching

## 2024-06-21 ENCOUNTER — Ambulatory Visit: Admitting: Orthopedic Surgery

## 2024-06-21 ENCOUNTER — Encounter: Payer: Self-pay | Admitting: Orthopedic Surgery

## 2024-06-21 ENCOUNTER — Inpatient Hospital Stay: Admitting: Infectious Diseases

## 2024-06-21 DIAGNOSIS — L97312 Non-pressure chronic ulcer of right ankle with fat layer exposed: Secondary | ICD-10-CM

## 2024-06-21 DIAGNOSIS — S91001A Unspecified open wound, right ankle, initial encounter: Secondary | ICD-10-CM

## 2024-06-21 NOTE — Progress Notes (Unsigned)
 Office Visit Note   Patient: Brandon Flynn           Date of Birth: 21-Oct-1965           MRN: [id number] Visit Date: 06/21/2024              Requested by: [name], MD [address] [place],  [place] [address] PCP: [name], MD  Chief Complaint  Patient presents with   Right Leg - Routine Post Op    06/13/2024 and 06/15/2024 I&D right leg      HPI: Discussed the use of AI scribe software for clinical note transcription with the patient, who gave verbal consent to proceed.  History of Present Illness      Assessment & Plan: Visit Diagnoses:  1. Skin ulcer of right ankle with fat layer exposed (HCC)   2. Ankle wound, right, initial encounter     Plan: Assessment and Plan Assessment & Plan       Follow-Up Instructions: Return in about 2 weeks (around 07/05/2024).   Ortho Exam  Patient is alert, oriented, no adenopathy, well-dressed, normal affect, normal respiratory effort. Physical Exam       Imaging: No results found. No images are attached to the encounter.  Labs: Lab Results  Component Value Date   REPTSTATUS 06/19/2024 FINAL 06/13/2024   GRAMSTAIN  06/13/2024    RARE WBC PRESENT, PREDOMINANTLY PMN FEW GRAM POSITIVE COCCI FEW GRAM POSITIVE RODS    CULT  06/13/2024    RARE PSEUDOMONAS AERUGINOSA FEW DIPHTHEROIDS(CORYNEBACTERIUM SPECIES) Standardized susceptibility testing for this organism is not available. RARE BACTEROIDES OVATUS BETA LACTAMASE POSITIVE Performed at [facility], [address] [place], [place] [address]    LABORGA PSEUDOMONAS AERUGINOSA 06/13/2024     Lab Results  Component Value Date   ALBUMIN 4.2 06/13/2024    Lab Results  Component Value Date   MG 2.0 06/15/2024   No results found for: VD25OH  No results found for: PREALBUMIN    Latest Ref Rng & Units 06/15/2024    3:50 AM 06/14/2024    4:30 AM 06/13/2024   11:14 AM  CBC EXTENDED  WBC 4.0  - 10.5 K/uL 12.7  11.7  13.4   RBC 4.22 - 5.81 MIL/uL 4.58  4.38  5.04   Hemoglobin 13.0 - 17.0 g/dL 87.2  87.9  85.9   HCT 39.0 - 52.0 % 38.1  36.2  42.2   Platelets 150 - 400 K/uL 281  264  298   NEUT# 1.7 - 7.7 K/uL   9.5   Lymph# 0.7 - 4.0 K/uL   3.2      There is no height or weight on file to calculate BMI.  Orders:  No orders of the defined types were placed in this encounter.  No orders of the defined types were placed in this encounter.    Procedures: No procedures performed  Clinical Data: No additional findings.  ROS:  All other systems negative, except as noted in the HPI. Review of Systems  Objective: Vital Signs: There were no vitals taken for this visit.  Specialty Comments:  No specialty comments available.  PMFS History: Patient Active Problem List   Diagnosis Date Noted   Ankle abrasion with infection, left, initial encounter 06/13/2024   Ankle wound, right, initial encounter 05/18/2024   Past Medical History:  Diagnosis Date   Hypertension     History reviewed. No pertinent family history.  Past  Surgical History:  Procedure Laterality Date   APPLICATION OF WOUND VAC Right 06/13/2024   Procedure: WOUND VAC APPLICATION;  Surgeon: Harden Jerona GAILS, MD;  Location: [facility] OR;  Service: Orthopedics;  Laterality: Right;   APPLICATION OF WOUND VAC  06/15/2024   Procedure: APPLICATION, WOUND VAC;  Surgeon: Harden Jerona GAILS, MD;  Location: [facility] OR;  Service: Orthopedics;;   INCISION AND DRAINAGE OF DEEP ABSCESS, ANKLE Right 06/15/2024   Procedure: INCISION AND DRAINAGE OF DEEP ABSCESS, ANKLE;  Surgeon: Harden Jerona GAILS, MD;  Location: [facility] OR;  Service: Orthopedics;  Laterality: Right;  IRRIGATION AND DEBRIDEMENT OF RIGHT ANKLE   INCISION AND DRAINAGE OF DEEP ABSCESS, CALF Right 05/18/2024   Procedure: INCISION AND DRAINAGE OF DEEP ABSCESS, CALF;  Surgeon: Harden Jerona GAILS, MD;  Location: [facility] OR;  Service: Orthopedics;  Laterality: Right;  RIGHT ANKLE DEBRIDEMENT  AND TISSUE GRAFT   INCISION AND DRAINAGE OF DEEP ABSCESS, CALF Right 06/13/2024   Procedure: RIGHT CALF INCISION AND DRAINAGE OF DEEP ABSCESS;  Surgeon: Harden Jerona GAILS, MD;  Location: [facility] OR;  Service: Orthopedics;  Laterality: Right;   MOUTH SURGERY     Social History   Occupational History   Not on file  Tobacco Use   Smoking status: Never   Smokeless tobacco: Never  Vaping Use   Vaping status: Never Used  Substance and Sexual Activity   Alcohol use: Yes    Alcohol/week: 5.0 standard drinks of alcohol    Types: 5 Standard drinks or equivalent per week   Drug use: Not Currently   Sexual activity: Not on file

## 2024-06-22 LAB — AEROBIC/ANAEROBIC CULTURE W GRAM STAIN (SURGICAL/DEEP WOUND)

## 2024-06-25 ENCOUNTER — Ambulatory Visit (INDEPENDENT_AMBULATORY_CARE_PROVIDER_SITE_OTHER): Admitting: Infectious Diseases

## 2024-06-25 ENCOUNTER — Encounter: Payer: Self-pay | Admitting: Infectious Diseases

## 2024-06-25 ENCOUNTER — Other Ambulatory Visit: Payer: Self-pay

## 2024-06-25 VITALS — BP 137/93 | HR 102 | Temp 98.9°F

## 2024-06-25 DIAGNOSIS — X58XXXD Exposure to other specified factors, subsequent encounter: Secondary | ICD-10-CM | POA: Diagnosis not present

## 2024-06-25 DIAGNOSIS — I129 Hypertensive chronic kidney disease with stage 1 through stage 4 chronic kidney disease, or unspecified chronic kidney disease: Secondary | ICD-10-CM

## 2024-06-25 DIAGNOSIS — I10 Essential (primary) hypertension: Secondary | ICD-10-CM | POA: Insufficient documentation

## 2024-06-25 DIAGNOSIS — S91001D Unspecified open wound, right ankle, subsequent encounter: Secondary | ICD-10-CM | POA: Diagnosis not present

## 2024-06-25 DIAGNOSIS — S91001A Unspecified open wound, right ankle, initial encounter: Secondary | ICD-10-CM | POA: Insufficient documentation

## 2024-06-25 DIAGNOSIS — Z5181 Encounter for therapeutic drug level monitoring: Secondary | ICD-10-CM | POA: Diagnosis not present

## 2024-06-25 DIAGNOSIS — N189 Chronic kidney disease, unspecified: Secondary | ICD-10-CM | POA: Diagnosis not present

## 2024-06-25 NOTE — Progress Notes (Addendum)
 "  Patient Active Problem List   Diagnosis Date Noted   Ankle abrasion with infection, left, initial encounter 06/13/2024   Ankle wound, right, initial encounter 05/18/2024    Patient's Medications  New Prescriptions   No medications on file  Previous Medications   AMLODIPINE  (NORVASC ) 5 MG TABLET    Take 5 mg by mouth daily.   ASCORBIC ACID  (VITAMIN C ) 1000 MG TABLET    Take 1,000 mg by mouth daily.   CIPROFLOXACIN  (CIPRO ) 750 MG TABLET    Take 1 tablet (750 mg total) by mouth 2 (two) times daily for 10 days.   HYDROCODONE -ACETAMINOPHEN  (NORCO/VICODIN) 5-325 MG TABLET    Take 1 tablet by mouth every 4 (four) hours as needed.   IBUPROFEN (ADVIL) 400 MG TABLET    Take 400 mg by mouth daily.   LINEZOLID  (ZYVOX ) 600 MG TABLET    Take 1 tablet (600 mg total) by mouth every 12 (twelve) hours for 10 days.   METRONIDAZOLE  (FLAGYL ) 500 MG TABLET    Take 1 tablet (500 mg total) by mouth every 12 (twelve) hours for 10 days.  Modified Medications   No medications on file  Discontinued Medications   No medications on file    Subjective: 58 year old male with prior history as below including HTN, CKD who is here for HFU after recent admission 12/8-12/15 for rt ankle wound who had initial debridement on 11/14 (OR cx staph epi, diptheroids).  Discharged on 12/15 to complete 2 weeks course of linezolid , ciprofloxacin  and metronidazole  through 12/25 after serial debridements on 12/10 (OR cx with PsA, Klebsiella aerogenes, Bacteroides spp) then 12/12 where tissue margins were healthy and viable.   Was seen by Dr. Harden on 12/18, no signs of infection noted.  Recommended to continue wound care.   12/22 Compliant with all 3 po antibiotics with no missed doses or concerns.  Minimal drainage at rt ankle wound during dressing change. Some discomfort. Is still nonweightbearing.  Denies fever, chills, nausea vomiting or diarrhea. No other concerns.  Review of Systems: All systems reviewed with pertinent  positive and negative as listed above  Past Medical History:  Diagnosis Date   Hypertension    Past Surgical History:  Procedure Laterality Date   APPLICATION OF WOUND VAC Right 06/13/2024   Procedure: WOUND VAC APPLICATION;  Surgeon: Harden Jerona GAILS, MD;  Location[facility] OR;  Service: Orthopedics;  Laterality: Right;   APPLICATION OF WOUND VAC  06/15/2024   Procedure: APPLICATION, WOUND VAC;  Surgeon: Harden Jerona GAILS, MD;  Location: [facility] OR;  Service: Orthopedics;;   INCISION AND DRAINAGE OF DEEP ABSCESS, ANKLE Right 06/15/2024   Procedure: INCISION AND DRAINAGE OF DEEP ABSCESS, ANKLE;  Surgeon: Harden Jerona GAILS, MD;  Location: [facility] OR;  Service: Orthopedics;  Laterality: Right;  IRRIGATION AND DEBRIDEMENT OF RIGHT ANKLE   INCISION AND DRAINAGE OF DEEP ABSCESS, CALF Right 05/18/2024   Procedure: INCISION AND DRAINAGE OF DEEP ABSCESS, CALF;  Surgeon: Harden Jerona GAILS, MD;  Location: [facility] OR;  Service: Orthopedics;  Laterality: Right;  RIGHT ANKLE DEBRIDEMENT AND TISSUE GRAFT   INCISION AND DRAINAGE OF DEEP ABSCESS, CALF Right 06/13/2024   Procedure: RIGHT CALF INCISION AND DRAINAGE OF DEEP ABSCESS;  Surgeon: Harden Jerona GAILS, MD;  Location: [facility] OR;  Service: Orthopedics;  Laterality: Right;   MOUTH SURGERY     Social History[1]  No family history on file.  Allergies[2]  Health Maintenance  Topic Date Due   HIV Screening  Never done  Hepatitis C Screening  Never done   DTaP/Tdap/Td (1 - Tdap) Never done   Hepatitis B Vaccines 19-59 Average Risk (1 of 3 - 19+ 3-dose series) Never done   Colonoscopy  Never done   Pneumococcal Vaccine: 50+ Years (1 of 1 - PCV) Never done   Influenza Vaccine  Never done   Zoster Vaccines- Shingrix (2 of 2) 07/19/2024   COVID-19 Vaccine  Completed   HPV VACCINES  Aged Out   Meningococcal B Vaccine  Aged Out    Objective: BP (!) 137/93   Pulse (!) 102   Temp 98.9 F (37.2 C) (Oral)   SpO2 98%   Physical Exam Constitutional:      Appearance: Normal  appearance.  HENT:     Head: Normocephalic and atraumatic.      Mouth: Mucous membranes are moist.  Eyes:    Conjunctiva/sclera: Conjunctivae normal.     Pupils: Pupils are equal, round, and b/l symmetrical    Cardiovascular:     Rate and Rhythm: Normal rate    Heart sounds:   Pulmonary:     Effort: Pulmonary effort is normal.     Breath sounds:   Abdominal:     General: Non distended     Palpations:   Musculoskeletal:        General: ambulatory with a walker.   Posterior rt ankle wound with healthy granulation tissue, with some areas of bleeding, no drainage or purulence of surrounding cellulitis   Skin:    General: Skin is warm and dry.     Comments:  Neurological:     General: grossly non focal     Mental Status: awake, alert   Psychiatric:        Mood and Affect: Mood normal.   Lab Results Lab Results  Component Value Date   WBC 12.7 (H) 06/15/2024   HGB 12.7 (L) 06/15/2024   HCT 38.1 (L) 06/15/2024   MCV 83.2 06/15/2024   PLT 281 06/15/2024    Lab Results  Component Value Date   CREATININE 1.37 (H) 06/15/2024   BUN 14 06/15/2024   NA 137 06/15/2024   K 3.6 06/15/2024   CL 104 06/15/2024   CO2 24 06/15/2024    Lab Results  Component Value Date   ALT 38 06/13/2024   AST 28 06/13/2024   ALKPHOS 47 06/13/2024   BILITOT 0.9 06/13/2024    No results found for: CHOL, HDL, LDLCALC, LDLDIRECT, TRIG, CHOLHDL No results found for: LABRPR, RPRTITER No results found for: HIV1RNAQUANT, HIV1RNAVL, CD4TABS   Microbiology  Results for orders placed or performed during the hospital encounter of 06/13/24  Aerobic/Anaerobic Culture w Gram Stain (surgical/deep wound)     Status: None   Collection Time: 06/13/24  2:23 PM   Specimen: Soft Tissue, Other  Result Value Ref Range Status   Specimen Description TISSUE  Final   Special Requests STAT RIGHT ANKLE CULTURE  Final   Gram Stain   Final    RARE WBC PRESENT,BOTH PMN AND MONONUCLEAR RARE  GRAM POSITIVE COCCI IN PAIRS    Culture   Final    FEW PSEUDOMONAS AERUGINOSA SUSCEPTIBILITIES PERFORMED ON PREVIOUS CULTURE WITHIN THE LAST 5 DAYS. RARE KLEBSIELLA AEROGENES RARE BACTEROIDES SPECIES NOT FRAGILIS BETA LACTAMASE POSITIVE MODERATE DIPHTHEROIDS(CORYNEBACTERIUM SPECIES) Standardized susceptibility testing for this organism is not available. Performed at [facility], [address], [place], [place] [address]    Report Status 06/22/2024 FINAL  Final   Organism ID, Bacteria KLEBSIELLA  AEROGENES  Final      Susceptibility   Klebsiella aerogenes - MIC*    CEFEPIME  <=0.12 SENSITIVE Sensitive     ERTAPENEM <=0.12 SENSITIVE Sensitive     CEFTRIAXONE <=0.25 SENSITIVE Sensitive     CIPROFLOXACIN  <=0.06 SENSITIVE Sensitive     GENTAMICIN <=1 SENSITIVE Sensitive     MEROPENEM <=0.25 SENSITIVE Sensitive     TRIMETH/SULFA <=20 SENSITIVE Sensitive     PIP/TAZO Value in next row Sensitive      <=4 SENSITIVEThis is a modified FDA-approved test that has been validated and its performance characteristics determined by the reporting laboratory.  This laboratory is certified under the Clinical Laboratory Improvement Amendments CLIA as qualified to perform high complexity clinical laboratory testing.    * RARE KLEBSIELLA AEROGENES  Aerobic/Anaerobic Culture w Gram Stain (surgical/deep wound)     Status: None   Collection Time: 06/13/24  2:24 PM   Specimen: Soft Tissue, Other  Result Value Ref Range Status   Specimen Description TISSUE RIGHT ANKLE  Final   Special Requests PT ON VANC, TOBRAMYCIN   Final   Gram Stain   Final    RARE WBC PRESENT, PREDOMINANTLY PMN FEW GRAM POSITIVE COCCI FEW GRAM POSITIVE RODS    Culture   Final    RARE PSEUDOMONAS AERUGINOSA FEW DIPHTHEROIDS(CORYNEBACTERIUM SPECIES) Standardized susceptibility testing for this organism is not available. RARE BACTEROIDES OVATUS BETA LACTAMASE POSITIVE Performed at [facility], [address],  [place], [place] [address]    Report Status 06/19/2024 FINAL  Final   Organism ID, Bacteria PSEUDOMONAS AERUGINOSA  Final      Susceptibility   Pseudomonas aeruginosa - MIC*    MEROPENEM <=0.25 SENSITIVE Sensitive     CIPROFLOXACIN  0.5 SENSITIVE Sensitive     IMIPENEM 1 SENSITIVE Sensitive     CEFTAZIDIME/AVIBACTAM 2 SENSITIVE Sensitive     CEFTOLOZANE/TAZOBACTAM 1 SENSITIVE Sensitive     TOBRAMYCIN  <=1 SENSITIVE Sensitive     CEFTAZIDIME 2 SENSITIVE Sensitive     CEFEPIME  8 SENSITIVE Sensitive     * RARE PSEUDOMONAS AERUGINOSA  Fungus Culture With Stain     Status: None (Preliminary result)   Collection Time: 06/13/24  2:27 PM   Specimen: Soft Tissue, Other  Result Value Ref Range Status   Fungus Stain Final report  Final    Comment: (NOTE) Performed At: [facility] [address] [place], [place] [id number] Jennette Shorter MD Ey:[id number]    Fungus (Mycology) Culture PENDING  Incomplete   Fungal Source TISSUE  Final    Comment: RIGHT ANKLE Performed at [facility], [address], [place], [place] [address]   Acid Fast Smear (AFB)     Status: None   Collection Time: 06/13/24  2:27 PM   Specimen: Soft Tissue, Other  Result Value Ref Range Status   AFB Specimen Processing Comment  Final    Comment: Tissue Grinding and Digestion/Decontamination   Acid Fast Smear Negative  Final    Comment: (NOTE) Performed At: [facility] [address] [place], [place] [id number] Jennette Shorter MD Ey:[id number]    Source (AFB) TISSUE  Final    Comment: RIGHT ANKLE Performed at [facility], [address] [place], [place] [address]   Fungus Culture Result     Status: None   Collection Time: 06/13/24  2:27 PM  Result Value Ref Range Status   Result 1 Comment  Final    Comment: (NOTE) KOH/Calcofluor preparation:  no fungus observed. Performed At: [facility] [address] [place], [place] [id number] Jennette Shorter MD Ey:[id number]   Surgical  pcr screen     Status: None   Collection Time: 06/15/24 11:01 AM   Specimen: Nasal Mucosa; Nasal Swab  Result Value Ref Range Status   MRSA, PCR NEGATIVE NEGATIVE Final   Staphylococcus aureus NEGATIVE NEGATIVE Final    Comment: (NOTE) The Xpert SA Assay (FDA approved for NASAL specimens in patients 59 years of age and older), is one component of a comprehensive surveillance program. It is not intended to diagnose infection nor to guide or monitor treatment. Performed at [facility], 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 - He noticed a tick in his rt feet when he took off his socks which has started to latch and removed immediately ( Reports completing 10 days of doxycycline). He then developed a recurrent scab that would fall off but recur despite using doxycycline and mupirocin. He reports wound might have contaminated [place]. He was given 10 days course of ciprofloxacin  on 05/28/24   11/14 s/p excision debridement of necrotic ulcer of lateral rt ankle. OR cx staph epi, diptheroids   12/10 s/p excisional debridement. OR cx with PsA, Klebsiella aerogenes, Bacteroides ovatus, Bacteroides spp, Diptheroids; Gram stain with GPC, GPR. AFB stain negative, fungal stain negative    OR findings: Patient had a large hematoma with ischemic skin and soft tissue muscle and fascia.  Ischemic changes possibly secondary to hypertension uncontrolled    12/12 s/p excisional debridement. Tissue margins were healthy and viable.   Plan  - continue Linezolid , ciprofloxacin  and metronidazole  through 12/25 as planned then stop - labs today  - fu with Ortho for wound care  - discussed signs and symptoms for recurrence of infection and to seek attention  - fu as needed, new concerns   # HTN  - BP controlled  - on  amlodipine  - fu with PCP  # CKD - will check Cr  I personally spent a total of 30 minutes in the care of the patient today including preparing to see the patient, getting/reviewing separately obtained history, performing a medically appropriate exam/evaluation, counseling and educating, placing orders, documenting clinical information in the EHR, independently interpreting results, and communicating results.    Of note, portions of this note may have been created with voice recognition software. While this note has been edited for accuracy, occasional wrong-word or sound-a-like substitutions may have occurred due to the inherent limitations of voice recognition software.   Annalee Joseph, MD [facility] 06/25/2024, 9:11 AM     [1]  Social History Tobacco Use   Smoking status: Never   Smokeless tobacco: Never  Vaping Use   Vaping status: Never Used  Substance Use Topics   Alcohol use: Yes    Alcohol/week: 5.0 standard drinks of alcohol    Types: 5 Standard drinks or equivalent per week   Drug use: Not Currently  [2]  Allergies Allergen Reactions   Sulfa Antibiotics Hives and Itching   "

## 2024-06-26 LAB — CBC
HCT: 39.3 % — ABNORMAL LOW (ref 39.4–51.1)
Hemoglobin: 12.7 g/dL — ABNORMAL LOW (ref 13.2–17.1)
MCH: 26.9 pg — ABNORMAL LOW (ref 27.0–33.0)
MCHC: 32.3 g/dL (ref 31.6–35.4)
MCV: 83.3 fL (ref 81.4–101.7)
MPV: 11 fL (ref 7.5–12.5)
Platelets: 298 Thousand/uL (ref 140–400)
RBC: 4.72 Million/uL (ref 4.20–5.80)
RDW: 13.9 % (ref 11.0–15.0)
WBC: 12.9 Thousand/uL — ABNORMAL HIGH (ref 3.8–10.8)

## 2024-06-26 LAB — BASIC METABOLIC PANEL WITH GFR
BUN/Creatinine Ratio: 9 (calc) (ref 6–22)
BUN: 15 mg/dL (ref 7–25)
CO2: 24 mmol/L (ref 20–32)
Calcium: 9.3 mg/dL (ref 8.6–10.3)
Chloride: 106 mmol/L (ref 98–110)
Creat: 1.58 mg/dL — ABNORMAL HIGH (ref 0.70–1.30)
Glucose, Bld: 102 mg/dL — ABNORMAL HIGH (ref 65–99)
Potassium: 3.9 mmol/L (ref 3.5–5.3)
Sodium: 139 mmol/L (ref 135–146)
eGFR: 50 mL/min/1.73m2 — ABNORMAL LOW

## 2024-06-26 LAB — C-REACTIVE PROTEIN: CRP: 4.4 mg/L

## 2024-06-29 ENCOUNTER — Ambulatory Visit: Payer: Self-pay | Admitting: Infectious Diseases

## 2024-07-03 ENCOUNTER — Inpatient Hospital Stay: Payer: Self-pay | Admitting: Infectious Diseases

## 2024-07-03 ENCOUNTER — Ambulatory Visit (INDEPENDENT_AMBULATORY_CARE_PROVIDER_SITE_OTHER): Admitting: Orthopedic Surgery

## 2024-07-03 DIAGNOSIS — L97312 Non-pressure chronic ulcer of right ankle with fat layer exposed: Secondary | ICD-10-CM

## 2024-07-03 LAB — ACID FAST CULTURE WITH REFLEXED SENSITIVITIES (MYCOBACTERIA): Acid Fast Culture: NEGATIVE

## 2024-07-04 ENCOUNTER — Encounter: Payer: Self-pay | Admitting: Orthopedic Surgery

## 2024-07-04 NOTE — Progress Notes (Signed)
 "  Office Visit Note   Patient: Brandon Flynn           Date of Birth: 05-13-1966           MRN: [id number] Visit Date: 07/03/2024              Requested by: [name], MD [address] [place],  [place] [address] PCP: [name], MD  Chief Complaint  Patient presents with   Right Leg - Routine Post Op      06/13/2024 and 06/15/2024 I&D right leg        HPI: Discussed the use of AI scribe software for clinical note transcription with the patient, who gave verbal consent to proceed.  History of Present Illness Brandon Flynn is a 58 year old male with a chronic non-healing foot ulcer who presents for follow-up of wound care and management.  He has a chronic ulcer on the foot with persistent drainage.  He experiences severe pain during dressing changes, describing 'pure lightning bolts' of pain localized to three nerve bundles, which is exacerbated when the gauze adheres to the denser yellow tissue. Pain is most pronounced during removal of the dressing, and the current once-daily Vashe dressing changes result in the dressing drying out and adhering to the wound, increasing discomfort.  He independently manages wound care, performing daily dressing changes and wound irrigation in the shower. He continues to use a kneeling scooter for mobility and offloading of the affected foot. He expresses concern regarding travel due to wound and mobility limitations, but feels he can manage with his scooter and wound care supplies.  He monitors his blood pressure at home, which has been in the 150s/90s range, and notes that readings are elevated following dressing changes, which he attributes to pain during the process.     Assessment & Plan: Visit Diagnoses:  1. Skin ulcer of right ankle with fat layer exposed (HCC)     Plan: Assessment and Plan Assessment & Plan Chronic non-healing ulcer of foot Chronic foot ulcer with 75%  granulation tissue and 25% fibrinous exudate. Delayed healing due to poor microcirculation. Pain during dressing changes indicates nerve regeneration and healing. Estimated healing time is three to four months. - Increased Vashe dressing changes to twice daily. - Advised daily shower and wound irrigation. - Initiated insurance authorization for [employer] tissue graft and wound vac. - Planned weekly or biweekly [employer] application in office upon approval. - Planned wound vac dressing application in office following [employer] application, pending approval. - Advised continued use of kneeling scooter for mobility and offloading. - Scheduled follow-up in two weeks.      Follow-Up Instructions: No follow-ups on file.   Ortho Exam  Patient is alert, oriented, no adenopathy, well-dressed, normal affect, normal respiratory effort. Physical Exam SKIN: Wound bed flat with granulation tissue, 25% fibrinous exudative tissue.      Imaging: No results found.   Labs: Lab Results  Component Value Date   CRP 4.4 06/25/2024   REPTSTATUS 06/19/2024 FINAL 06/13/2024   GRAMSTAIN  06/13/2024    RARE WBC PRESENT, PREDOMINANTLY PMN FEW GRAM POSITIVE COCCI FEW GRAM POSITIVE RODS    CULT  06/13/2024    RARE PSEUDOMONAS AERUGINOSA FEW DIPHTHEROIDS(CORYNEBACTERIUM SPECIES) Standardized susceptibility testing for this organism is not available. RARE BACTEROIDES OVATUS BETA LACTAMASE POSITIVE Performed at [facility], [address] [place], [place] [address]    LABORGA PSEUDOMONAS AERUGINOSA 06/13/2024  Lab Results  Component Value Date   ALBUMIN 4.2 06/13/2024    Lab Results  Component Value Date   MG 2.0 06/15/2024   No results found for: VD25OH  No results found for: PREALBUMIN    Latest Ref Rng & Units 06/25/2024    9:39 AM 06/15/2024    3:50 AM 06/14/2024    4:30 AM  CBC EXTENDED  WBC 3.8 - 10.8 Thousand/uL 12.9  12.7  11.7   RBC 4.20 - 5.80 Million/uL 4.72   4.58  4.38   Hemoglobin 13.2 - 17.1 g/dL 87.2  87.2  87.9   HCT 39.4 - 51.1 % 39.3  38.1  36.2   Platelets 140 - 400 Thousand/uL 298  281  264      There is no height or weight on file to calculate BMI.  Orders:  No orders of the defined types were placed in this encounter.  No orders of the defined types were placed in this encounter.    Procedures: No procedures performed  Clinical Data: No additional findings.  ROS:  All other systems negative, except as noted in the HPI. Review of Systems  Objective: Vital Signs: There were no vitals taken for this visit.  Specialty Comments:  No specialty comments available.  PMFS History: Patient Active Problem List   Diagnosis Date Noted   Medication monitoring encounter 06/25/2024   Wound of right ankle 06/25/2024   Hypertension 06/25/2024   Ankle abrasion with infection, left, initial encounter 06/13/2024   Ankle wound, right, initial encounter 05/18/2024   Past Medical History:  Diagnosis Date   Hypertension     History reviewed. No pertinent family history.  Past Surgical History:  Procedure Laterality Date   APPLICATION OF WOUND VAC Right 06/13/2024   Procedure: WOUND VAC APPLICATION;  Surgeon: Harden Jerona GAILS, MD;  Location: [facility] OR;  Service: Orthopedics;  Laterality: Right;   APPLICATION OF WOUND VAC  06/15/2024   Procedure: APPLICATION, WOUND VAC;  Surgeon: Harden Jerona GAILS, MD;  Location: [facility] OR;  Service: Orthopedics;;   INCISION AND DRAINAGE OF DEEP ABSCESS, ANKLE Right 06/15/2024   Procedure: INCISION AND DRAINAGE OF DEEP ABSCESS, ANKLE;  Surgeon: Harden Jerona GAILS, MD;  Location: [facility] OR;  Service: Orthopedics;  Laterality: Right;  IRRIGATION AND DEBRIDEMENT OF RIGHT ANKLE   INCISION AND DRAINAGE OF DEEP ABSCESS, CALF Right 05/18/2024   Procedure: INCISION AND DRAINAGE OF DEEP ABSCESS, CALF;  Surgeon: Harden Jerona GAILS, MD;  Location: [facility] OR;  Service: Orthopedics;  Laterality: Right;  RIGHT ANKLE DEBRIDEMENT AND TISSUE  GRAFT   INCISION AND DRAINAGE OF DEEP ABSCESS, CALF Right 06/13/2024   Procedure: RIGHT CALF INCISION AND DRAINAGE OF DEEP ABSCESS;  Surgeon: Harden Jerona GAILS, MD;  Location: [facility] OR;  Service: Orthopedics;  Laterality: Right;   MOUTH SURGERY     Social History   Occupational History   Not on file  Tobacco Use   Smoking status: Never   Smokeless tobacco: Never  Vaping Use   Vaping status: Never Used  Substance and Sexual Activity   Alcohol use: Yes    Alcohol/week: 5.0 standard drinks of alcohol    Types: 5 Standard drinks or equivalent per week   Drug use: Not Currently   Sexual activity: Not on file         "

## 2024-07-13 LAB — FUNGUS CULTURE RESULT

## 2024-07-13 LAB — FUNGAL ORGANISM REFLEX

## 2024-07-13 LAB — FUNGUS CULTURE WITH STAIN

## 2024-07-17 ENCOUNTER — Ambulatory Visit: Admitting: Orthopedic Surgery

## 2024-07-17 ENCOUNTER — Encounter: Payer: Self-pay | Admitting: Orthopedic Surgery

## 2024-07-17 DIAGNOSIS — S91001A Unspecified open wound, right ankle, initial encounter: Secondary | ICD-10-CM

## 2024-07-17 DIAGNOSIS — L97312 Non-pressure chronic ulcer of right ankle with fat layer exposed: Secondary | ICD-10-CM

## 2024-07-17 MED ORDER — GABAPENTIN 300 MG PO CAPS: 300.0000 mg | ORAL_CAPSULE | Freq: Three times a day (TID) | ORAL | 3 refills | Status: AC

## 2024-07-17 NOTE — Progress Notes (Signed)
 "  Office Visit Note   Patient: Brandon Flynn           Date of Birth: 04-Nov-1965           MRN: [id number] Visit Date: 07/17/2024              Requested by: [name], MD [address] [place],  [place] [address] PCP: [name], MD  Chief Complaint  Patient presents with   Right Ankle - Routine Post Op      HPI: Discussed the use of AI scribe software for clinical note transcription with the patient, who gave verbal consent to proceed.  History of Present Illness Brandon Flynn is a 59 year old male with a chronic right lateral ankle ulcer who presents for evaluation of worsening neuropathic pain and wound management.  He has a chronic ulcer on the lateral aspect of the right ankle, currently measuring approximately 10 x 5.5 cm. He applies Vosh for local wound care.  He experiences severe neuropathic pain localized to the superior margin of the ulcer, described as like lightning bolts. Pain episodes are unpredictable and most pronounced at night, occasionally causing gasping. He is able to go up to five days without analgesics, but then may have one to two nights of significant pain requiring Vicodin, which allows him to sleep until morning. Periods of several days without pain medication are considered successful.  He has not previously used gabapentin  or Neurontin . Daytime pain is manageable with distraction, but nocturnal pain is more difficult to tolerate and disrupts sleep. He typically sleeps in shifts, obtaining three to four hours initially, but may have difficulty returning to sleep for one to two hours if awakened by pain.     Assessment & Plan: Visit Diagnoses: No diagnosis found.  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 No infection or cellulitis. Strong  dorsalis pedis pulse. - Prescribed gabapentin  300 mg nightly for neuropathic pain; discussed increasing to three times daily if needed, with sedation as a potential side effect. - Planned outpatient surgical application of Kerasys tissue graft on Friday, pending approval; will pursue inpatient treatment if outpatient application is not approved. - Anticipated tissue graft and antibiotics to promote wound healing and reduce pain.      Follow-Up Instructions: Return in about 2 weeks (around 07/31/2024).   Ortho Exam  Patient is alert, oriented, no adenopathy, well-dressed, normal affect, normal respiratory effort. Physical Exam EXTREMITIES: Lateral right ankle ulcer measuring 10x5.5x 0.5 cm with flat granulation tissue. Hypersensitivity to light touch proximally. Strong, palpable dorsalis pedis pulse. SKIN: No cellulitis, no purulent drainage, no signs of infection.      Imaging: No results found. No images are attached to the encounter.  Labs: Lab Results  Component Value Date   CRP 4.4 06/25/2024   REPTSTATUS 06/19/2024 FINAL 06/13/2024   GRAMSTAIN  06/13/2024    RARE WBC PRESENT, PREDOMINANTLY PMN FEW GRAM POSITIVE COCCI FEW GRAM POSITIVE RODS    CULT  06/13/2024    RARE PSEUDOMONAS AERUGINOSA FEW DIPHTHEROIDS(CORYNEBACTERIUM SPECIES) Standardized susceptibility testing for this organism is not available. RARE BACTEROIDES OVATUS BETA LACTAMASE POSITIVE Performed at [facility], [address], [place], [place] [address]    LABORGA PSEUDOMONAS AERUGINOSA 06/13/2024     Lab Results  Component Value Date   ALBUMIN 4.2 06/13/2024  Lab Results  Component Value Date   MG 2.0 06/15/2024   No results found for: VD25OH  No results found for: PREALBUMIN    Latest Ref Rng & Units 06/25/2024    9:39 AM 06/15/2024    3:50 AM 06/14/2024    4:30 AM  CBC EXTENDED  WBC 3.8 - 10.8 Thousand/uL 12.9  12.7  11.7   RBC 4.20 - 5.80 Million/uL 4.72  4.58  4.38    Hemoglobin 13.2 - 17.1 g/dL 87.2  87.2  87.9   HCT 39.4 - 51.1 % 39.3  38.1  36.2   Platelets 140 - 400 Thousand/uL 298  281  264      There is no height or weight on file to calculate BMI.  Orders:  No orders of the defined types were placed in this encounter.  Meds ordered this encounter  Medications   gabapentin  (NEURONTIN ) 300 MG capsule    Sig: Take 1 capsule (300 mg total) by mouth 3 (three) times daily. 3 times a day when necessary neuropathy pain    Dispense:  90 capsule    Refill:  3     Procedures: No procedures performed  Clinical Data: No additional findings.  ROS:  All other systems negative, except as noted in the HPI. Review of Systems  Objective: Vital Signs: There were no vitals taken for this visit.  Specialty Comments:  No specialty comments available.  PMFS History: Patient Active Problem List   Diagnosis Date Noted   Medication monitoring encounter 06/25/2024   Wound of right ankle 06/25/2024   Hypertension 06/25/2024   Ankle abrasion with infection, left, initial encounter 06/13/2024   Ankle wound, right, initial encounter 05/18/2024   Past Medical History:  Diagnosis Date   Hypertension     History reviewed. No pertinent family history.  Past Surgical History:  Procedure Laterality Date   APPLICATION OF WOUND VAC Right 06/13/2024   Procedure: WOUND VAC APPLICATION;  Surgeon: Harden Jerona GAILS, MD;  Location: [facility] OR;  Service: Orthopedics;  Laterality: Right;   APPLICATION OF WOUND VAC  06/15/2024   Procedure: APPLICATION, WOUND VAC;  Surgeon: Harden Jerona GAILS, MD;  Location: [facility] OR;  Service: Orthopedics;;   INCISION AND DRAINAGE OF DEEP ABSCESS, ANKLE Right 06/15/2024   Procedure: INCISION AND DRAINAGE OF DEEP ABSCESS, ANKLE;  Surgeon: Harden Jerona GAILS, MD;  Location: [facility] OR;  Service: Orthopedics;  Laterality: Right;  IRRIGATION AND DEBRIDEMENT OF RIGHT ANKLE   INCISION AND DRAINAGE OF DEEP ABSCESS, CALF Right 05/18/2024   Procedure:  INCISION AND DRAINAGE OF DEEP ABSCESS, CALF;  Surgeon: Harden Jerona GAILS, MD;  Location: [facility] OR;  Service: Orthopedics;  Laterality: Right;  RIGHT ANKLE DEBRIDEMENT AND TISSUE GRAFT   INCISION AND DRAINAGE OF DEEP ABSCESS, CALF Right 06/13/2024   Procedure: RIGHT CALF INCISION AND DRAINAGE OF DEEP ABSCESS;  Surgeon: Harden Jerona GAILS, MD;  Location: [facility] OR;  Service: Orthopedics;  Laterality: Right;   MOUTH SURGERY     Social History   Occupational History   Not on file  Tobacco Use   Smoking status: Never   Smokeless tobacco: Never  Vaping Use   Vaping status: Never Used  Substance and Sexual Activity   Alcohol use: Yes    Alcohol/week: 5.0 standard drinks of alcohol    Types: 5 Standard drinks or equivalent per week   Drug use: Not Currently   Sexual activity: Not on file         "

## 2024-07-18 ENCOUNTER — Encounter (HOSPITAL_COMMUNITY): Payer: Self-pay | Admitting: Orthopedic Surgery

## 2024-07-18 ENCOUNTER — Other Ambulatory Visit: Payer: Self-pay

## 2024-07-18 NOTE — H&P (Signed)
 Brandon Flynn is an 59 y.o. male.   Chief Complaint: Chronic right ankle non healing wound HPI: Brandon Flynn is a 59 year old male with a chronic right lateral ankle ulcer who presents for evaluation of worsening neuropathic pain and wound management.   He has a chronic ulcer on the lateral aspect of the right ankle, currently measuring approximately 10 x 5.5 cm.    Past Medical History:  Diagnosis Date   Hypertension     Past Surgical History:  Procedure Laterality Date   APPLICATION OF WOUND VAC Right 06/13/2024   Procedure: WOUND VAC APPLICATION;  Surgeon: Harden Jerona GAILS, MD;  Location: [facility] OR;  Service: Orthopedics;  Laterality: Right;   APPLICATION OF WOUND VAC  06/15/2024   Procedure: APPLICATION, WOUND VAC;  Surgeon: Harden Jerona GAILS, MD;  Location: [facility] OR;  Service: Orthopedics;;   INCISION AND DRAINAGE OF DEEP ABSCESS, ANKLE Right 06/15/2024   Procedure: INCISION AND DRAINAGE OF DEEP ABSCESS, ANKLE;  Surgeon: Harden Jerona GAILS, MD;  Location: [facility] OR;  Service: Orthopedics;  Laterality: Right;  IRRIGATION AND DEBRIDEMENT OF RIGHT ANKLE   INCISION AND DRAINAGE OF DEEP ABSCESS, CALF Right 05/18/2024   Procedure: INCISION AND DRAINAGE OF DEEP ABSCESS, CALF;  Surgeon: Harden Jerona GAILS, MD;  Location: [facility] OR;  Service: Orthopedics;  Laterality: Right;  RIGHT ANKLE DEBRIDEMENT AND TISSUE GRAFT   INCISION AND DRAINAGE OF DEEP ABSCESS, CALF Right 06/13/2024   Procedure: RIGHT CALF INCISION AND DRAINAGE OF DEEP ABSCESS;  Surgeon: Harden Jerona GAILS, MD;  Location: [facility] OR;  Service: Orthopedics;  Laterality: Right;   MOUTH SURGERY      No family history on file. Social History:  reports that he has never smoked. He has never used smokeless tobacco. He reports current alcohol use of about 5.0 standard drinks of alcohol per week. He reports that he does not currently use drugs.  Allergies: Allergies[1]  No medications prior to admission.    No results found for this or any previous  visit (from the past 48 hours). No results found.  Review of Systems  All other systems reviewed and are negative.   There were no vitals taken for this visit. Physical Exam  Patient is alert, oriented, no adenopathy, well-dressed, normal affect, normal respiratory effort.  EXTREMITIES: Lateral right ankle ulcer measuring 10x5.5x 0.5 cm with flat granulation tissue. Hypersensitivity to light touch proximally. Strong, palpable dorsalis pedis pulse. SKIN: No cellulitis, no purulent drainage, no signs of infection.    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 No infection or cellulitis. Strong dorsalis pedis pulse. - Prescribed gabapentin  300 mg nightly for neuropathic pain; discussed increasing to three times daily if needed, with sedation as a potential side effect. - Planned outpatient surgical application of Kerasys tissue graft on Friday, pending approval; will pursue inpatient treatment if outpatient application is not approved. - Anticipated tissue graft and antibiotics to promote wound healing and reduce pain.  Brandon Deland Collet, PA-C 07/18/2024, 1:53 PM       [1]  Allergies Allergen Reactions   Sulfa Antibiotics Hives and Itching

## 2024-07-18 NOTE — Progress Notes (Signed)
 PCP - [name], MD  Cardiologist -   PPM/ICD - denies Device Orders - n/a Rep Notified - n/a  Chest x-ray - denies EKG - 06-13-24 Stress Test - denies ECHO - denies Cardiac Cath - denies  CPAP - denies  Dm denies  Blood Thinner Instructions: denies Aspirin  Instructions: denies  ERAS Protcol - clear liquids until 6:00  COVID TEST- n/a  Anesthesia review: no  Patient verbally denies any shortness of breath, fever, cough and chest pain during phone call   -------------  SDW INSTRUCTIONS given:  Your procedure is scheduled on July 20, 2024.  Report to [facility] Main Entrance A at 6:30 A.M., and check in at the Admitting office.  Call this number if you have problems the morning of surgery:  [phone]   Remember:  Do not eat after midnight the night before your surgery  You may drink clear liquids until 6:00 the morning of your surgery.   Clear liquids allowed are: Water, Non-Citrus Juices (without pulp), Carbonated Beverages, Clear Tea, Black Coffee Only, and Gatorade    Take these medicines the morning of surgery with A SIP OF WATER  amLODipine  (NORVASC )      As of today, STOP taking any Aspirin  (unless otherwise instructed by your surgeon) Aleve, Naproxen, Ibuprofen , Motrin , Advil , Goody's, BC's, all herbal medications, fish oil, and all vitamins.                      Do not wear jewelry, make up, or nail polish            Do not wear lotions, powders, perfumes/colognes, or deodorant.            Do not shave 48 hours prior to surgery.  Men may shave face and neck.            Do not bring valuables to the hospital.            [facility] is not responsible for any belongings or valuables.  Do NOT Smoke (Tobacco/Vaping) 24 hours prior to your procedure If you use a CPAP at night, you may bring all equipment for your overnight stay.   Contacts, glasses, dentures or bridgework may not be worn into surgery.      For patients admitted to the  hospital, discharge time will be determined by your treatment team.   Patients discharged the day of surgery will not be allowed to drive home, and someone needs to stay with them for 24 hours.    Special instructions:   [place]- Preparing For Surgery  Before surgery, you can play an important role. Because skin is not sterile, your skin needs to be as free of germs as possible. You can reduce the number of germs on your skin by washing with CHG (chlorahexidine gluconate) Soap before surgery.  CHG is an antiseptic cleaner which kills germs and bonds with the skin to continue killing germs even after washing.    Oral Hygiene is also important to reduce your risk of infection.  Remember - BRUSH YOUR TEETH THE MORNING OF SURGERY WITH YOUR REGULAR TOOTHPASTE  Please do not use if you have an allergy to CHG or antibacterial soaps. If your skin becomes reddened/irritated stop using the CHG.  Do not shave (including legs and underarms) for at least 48 hours prior to first CHG shower. It is OK to shave your face.  Please follow these instructions carefully.   Shower the OMNICOM  SURGERY and the MORNING OF SURGERY with DIAL Soap.   Pat yourself dry with a CLEAN TOWEL.  Wear CLEAN PAJAMAS to bed the night before surgery  Place CLEAN SHEETS on your bed the night of your first shower and DO NOT SLEEP WITH PETS.   Day of Surgery: Please shower morning of surgery  Wear Clean/Comfortable clothing the morning of surgery Do not apply any deodorants/lotions.   Remember to brush your teeth WITH YOUR REGULAR TOOTHPASTE.   Questions were answered. Patient verbalized understanding of instructions.

## 2024-07-20 ENCOUNTER — Encounter (HOSPITAL_COMMUNITY): Payer: Self-pay | Admitting: Anesthesiology

## 2024-07-20 ENCOUNTER — Inpatient Hospital Stay (HOSPITAL_COMMUNITY)
Admission: RE | Admit: 2024-07-20 | Discharge: 2024-07-26 | DRG: 574 | Disposition: A | Discharge status: Home / Self Care | Attending: Orthopedic Surgery | Admitting: Orthopedic Surgery

## 2024-07-20 ENCOUNTER — Encounter (HOSPITAL_COMMUNITY)
Admission: RE | Disposition: A | Discharge status: Home Health | Payer: Self-pay | Source: Home / Self Care | Attending: Orthopedic Surgery

## 2024-07-20 ENCOUNTER — Ambulatory Visit (HOSPITAL_COMMUNITY): Payer: Self-pay | Admitting: Anesthesiology

## 2024-07-20 ENCOUNTER — Telehealth: Payer: Self-pay | Admitting: Orthopedic Surgery

## 2024-07-20 DIAGNOSIS — S91001A Unspecified open wound, right ankle, initial encounter: Secondary | ICD-10-CM | POA: Diagnosis not present

## 2024-07-20 DIAGNOSIS — A498 Other bacterial infections of unspecified site: Secondary | ICD-10-CM | POA: Insufficient documentation

## 2024-07-20 DIAGNOSIS — S90561D Insect bite (nonvenomous), right ankle, subsequent encounter: Secondary | ICD-10-CM

## 2024-07-20 DIAGNOSIS — G629 Polyneuropathy, unspecified: Secondary | ICD-10-CM | POA: Diagnosis present

## 2024-07-20 DIAGNOSIS — L97319 Non-pressure chronic ulcer of right ankle with unspecified severity: Principal | ICD-10-CM | POA: Diagnosis present

## 2024-07-20 DIAGNOSIS — Z1611 Resistance to penicillins: Secondary | ICD-10-CM | POA: Diagnosis present

## 2024-07-20 DIAGNOSIS — A499 Bacterial infection, unspecified: Secondary | ICD-10-CM

## 2024-07-20 DIAGNOSIS — L089 Local infection of the skin and subcutaneous tissue, unspecified: Secondary | ICD-10-CM | POA: Diagnosis present

## 2024-07-20 DIAGNOSIS — B965 Pseudomonas (aeruginosa) (mallei) (pseudomallei) as the cause of diseases classified elsewhere: Secondary | ICD-10-CM | POA: Diagnosis present

## 2024-07-20 DIAGNOSIS — G8929 Other chronic pain: Secondary | ICD-10-CM | POA: Diagnosis present

## 2024-07-20 DIAGNOSIS — I129 Hypertensive chronic kidney disease with stage 1 through stage 4 chronic kidney disease, or unspecified chronic kidney disease: Secondary | ICD-10-CM | POA: Diagnosis present

## 2024-07-20 DIAGNOSIS — L02415 Cutaneous abscess of right lower limb: Secondary | ICD-10-CM | POA: Diagnosis present

## 2024-07-20 DIAGNOSIS — W57XXXD Bitten or stung by nonvenomous insect and other nonvenomous arthropods, subsequent encounter: Secondary | ICD-10-CM

## 2024-07-20 DIAGNOSIS — Z882 Allergy status to sulfonamides status: Secondary | ICD-10-CM

## 2024-07-20 DIAGNOSIS — N1831 Chronic kidney disease, stage 3a: Secondary | ICD-10-CM | POA: Diagnosis present

## 2024-07-20 DIAGNOSIS — Z79899 Other long term (current) drug therapy: Secondary | ICD-10-CM

## 2024-07-20 HISTORY — PX: INCISION AND DRAINAGE OF DEEP ABSCESS, ANKLE: SHX7360

## 2024-07-20 HISTORY — PX: ALLOGRAFT APPLICATION: SHX6404

## 2024-07-20 LAB — COMPREHENSIVE METABOLIC PANEL WITH GFR
ALT: 31 U/L (ref 0–44)
AST: 23 U/L (ref 15–41)
Albumin: 4.2 g/dL (ref 3.5–5.0)
Alkaline Phosphatase: 53 U/L (ref 38–126)
Anion gap: 12 (ref 5–15)
BUN: 14 mg/dL (ref 6–20)
CO2: 22 mmol/L (ref 22–32)
Calcium: 9.5 mg/dL (ref 8.9–10.3)
Chloride: 103 mmol/L (ref 98–111)
Creatinine, Ser: 1.31 mg/dL — ABNORMAL HIGH (ref 0.61–1.24)
GFR, Estimated: >60 mL/min
Glucose, Bld: 108 mg/dL — ABNORMAL HIGH (ref 70–99)
Potassium: 3.5 mmol/L (ref 3.5–5.1)
Sodium: 137 mmol/L (ref 135–145)
Total Bilirubin: 0.5 mg/dL (ref 0.0–1.2)
Total Protein: 7.4 g/dL (ref 6.5–8.1)

## 2024-07-20 LAB — CBC WITH DIFFERENTIAL/PLATELET
Abs Immature Granulocytes: 0.04 K/uL (ref 0.00–0.07)
Basophils Absolute: 0 K/uL (ref 0.0–0.1)
Basophils Relative: 0 %
Eosinophils Absolute: 0.1 K/uL (ref 0.0–0.5)
Eosinophils Relative: 0 %
HCT: 37.4 % — ABNORMAL LOW (ref 39.0–52.0)
Hemoglobin: 12.6 g/dL — ABNORMAL LOW (ref 13.0–17.0)
Immature Granulocytes: 0 %
Lymphocytes Relative: 25 %
Lymphs Abs: 3.3 K/uL (ref 0.7–4.0)
MCH: 27.3 pg (ref 26.0–34.0)
MCHC: 33.7 g/dL (ref 30.0–36.0)
MCV: 81.1 fL (ref 80.0–100.0)
Monocytes Absolute: 1 K/uL (ref 0.1–1.0)
Monocytes Relative: 7 %
Neutro Abs: 8.8 K/uL — ABNORMAL HIGH (ref 1.7–7.7)
Neutrophils Relative %: 68 %
Platelets: 310 K/uL (ref 150–400)
RBC: 4.61 MIL/uL (ref 4.22–5.81)
RDW: 14.6 % (ref 11.5–15.5)
WBC: 13.2 K/uL — ABNORMAL HIGH (ref 4.0–10.5)
nRBC: 0 % (ref 0.0–0.2)

## 2024-07-20 LAB — NO BLOOD PRODUCTS

## 2024-07-20 MED ORDER — VASHE WOUND IRRIGATION OPTIME
TOPICAL | Status: DC | PRN
  Administered 2024-07-20: 34 [oz_av]

## 2024-07-20 MED ORDER — MIDAZOLAM HCL (PF) 2 MG/2ML IJ SOLN
INTRAMUSCULAR | Status: DC | PRN
  Administered 2024-07-20: 2 mg via INTRAVENOUS

## 2024-07-20 MED ORDER — AMISULPRIDE (ANTIEMETIC) 5 MG/2ML IV SOLN: 10.0000 mg | Freq: Once | INTRAVENOUS | Status: DC | PRN

## 2024-07-20 MED ORDER — FENTANYL CITRATE (PF) 100 MCG/2ML IJ SOLN
INTRAMUSCULAR | Status: AC
  Filled 2024-07-20: qty 2

## 2024-07-20 MED ORDER — LACTATED RINGERS IV SOLN: INTRAVENOUS | Status: DC | PRN

## 2024-07-20 MED ORDER — DOCUSATE SODIUM 100 MG PO CAPS
100.0000 mg | ORAL_CAPSULE | Freq: Two times a day (BID) | ORAL | Status: DC
  Filled 2024-07-20 (×10): qty 1

## 2024-07-20 MED ORDER — ONDANSETRON HCL 4 MG/2ML IJ SOLN
INTRAMUSCULAR | Status: DC | PRN
  Administered 2024-07-20: 4 mg via INTRAVENOUS

## 2024-07-20 MED ORDER — ACETAMINOPHEN 500 MG PO TABS
1000.0000 mg | ORAL_TABLET | Freq: Once | ORAL | Status: AC
  Administered 2024-07-20: 1000 mg via ORAL
  Filled 2024-07-20: qty 2

## 2024-07-20 MED ORDER — PHENYLEPHRINE 80 MCG/ML (10ML) SYRINGE FOR IV PUSH (FOR BLOOD PRESSURE SUPPORT)
PREFILLED_SYRINGE | INTRAVENOUS | Status: DC | PRN
  Administered 2024-07-20: 80 ug via INTRAVENOUS
  Administered 2024-07-20: 160 ug via INTRAVENOUS

## 2024-07-20 MED ORDER — ONDANSETRON HCL 4 MG/2ML IJ SOLN
INTRAMUSCULAR | Status: AC
  Filled 2024-07-20: qty 2

## 2024-07-20 MED ORDER — HYDROCODONE-ACETAMINOPHEN 7.5-325 MG PO TABS
1.0000 | ORAL_TABLET | ORAL | Status: DC | PRN
  Administered 2024-07-23 – 2024-07-24 (×2): 2 via ORAL
  Filled 2024-07-20 (×2): qty 2
  Filled 2024-07-20: qty 1

## 2024-07-20 MED ORDER — PROPOFOL 10 MG/ML IV BOLUS
INTRAVENOUS | Status: DC | PRN
  Administered 2024-07-20: 180 mg via INTRAVENOUS
  Administered 2024-07-20: 20 mg via INTRAVENOUS

## 2024-07-20 MED ORDER — ACETAMINOPHEN 325 MG PO TABS: 325.0000 mg | ORAL_TABLET | Freq: Four times a day (QID) | ORAL | Status: DC | PRN

## 2024-07-20 MED ORDER — FENTANYL CITRATE (PF) 100 MCG/2ML IJ SOLN
25.0000 ug | INTRAMUSCULAR | Status: DC | PRN
  Administered 2024-07-20 (×3): 50 ug via INTRAVENOUS

## 2024-07-20 MED ORDER — CEFAZOLIN SODIUM-DEXTROSE 2-4 GM/100ML-% IV SOLN
2.0000 g | INTRAVENOUS | Status: AC
  Administered 2024-07-20: 2 g via INTRAVENOUS
  Filled 2024-07-20: qty 100

## 2024-07-20 MED ORDER — VITAMIN C 500 MG PO TABS
1000.0000 mg | ORAL_TABLET | Freq: Every day | ORAL | Status: DC
  Administered 2024-07-20 – 2024-07-26 (×6): 1000 mg via ORAL
  Filled 2024-07-20 (×6): qty 2

## 2024-07-20 MED ORDER — ONDANSETRON HCL 4 MG/2ML IJ SOLN: 4.0000 mg | Freq: Once | INTRAMUSCULAR | Status: DC | PRN

## 2024-07-20 MED ORDER — MIDAZOLAM HCL 2 MG/2ML IJ SOLN
INTRAMUSCULAR | Status: AC
  Filled 2024-07-20: qty 2

## 2024-07-20 MED ORDER — VANCOMYCIN HCL 500 MG IV SOLR
INTRAVENOUS | Status: DC | PRN
  Administered 2024-07-20: 500 mg via TOPICAL

## 2024-07-20 MED ORDER — HYDRALAZINE HCL 20 MG/ML IJ SOLN
10.0000 mg | Freq: Once | INTRAMUSCULAR | Status: AC
  Administered 2024-07-20: 10 mg via INTRAVENOUS

## 2024-07-20 MED ORDER — LIDOCAINE 2% (20 MG/ML) 5 ML SYRINGE
INTRAMUSCULAR | Status: DC | PRN
  Administered 2024-07-20: 80 mg via INTRAVENOUS

## 2024-07-20 MED ORDER — PHENYLEPHRINE 80 MCG/ML (10ML) SYRINGE FOR IV PUSH (FOR BLOOD PRESSURE SUPPORT)
PREFILLED_SYRINGE | INTRAVENOUS | Status: AC
  Filled 2024-07-20: qty 10

## 2024-07-20 MED ORDER — ONDANSETRON HCL 4 MG PO TABS: 4.0000 mg | ORAL_TABLET | Freq: Four times a day (QID) | ORAL | Status: DC | PRN

## 2024-07-20 MED ORDER — HYDROMORPHONE HCL 1 MG/ML IJ SOLN
INTRAMUSCULAR | Status: AC
  Filled 2024-07-20: qty 0.5

## 2024-07-20 MED ORDER — METOCLOPRAMIDE HCL 5 MG/ML IJ SOLN: 5.0000 mg | Freq: Three times a day (TID) | INTRAMUSCULAR | Status: DC | PRN

## 2024-07-20 MED ORDER — HYDRALAZINE HCL 20 MG/ML IJ SOLN
INTRAMUSCULAR | Status: AC
  Filled 2024-07-20: qty 1

## 2024-07-20 MED ORDER — HYDROMORPHONE HCL 1 MG/ML IJ SOLN
INTRAMUSCULAR | Status: DC | PRN
  Administered 2024-07-20 (×2): .25 mg via INTRAVENOUS

## 2024-07-20 MED ORDER — ONDANSETRON HCL 4 MG/2ML IJ SOLN: 4.0000 mg | Freq: Four times a day (QID) | INTRAMUSCULAR | Status: DC | PRN

## 2024-07-20 MED ORDER — METOCLOPRAMIDE HCL 5 MG PO TABS: 5.0000 mg | ORAL_TABLET | Freq: Three times a day (TID) | ORAL | Status: DC | PRN

## 2024-07-20 MED ORDER — VANCOMYCIN HCL 500 MG IV SOLR
INTRAVENOUS | Status: AC
  Filled 2024-07-20: qty 10

## 2024-07-20 MED ORDER — DEXAMETHASONE SOD PHOSPHATE PF 10 MG/ML IJ SOLN
INTRAMUSCULAR | Status: AC
  Filled 2024-07-20: qty 1

## 2024-07-20 MED ORDER — HYDROCODONE-ACETAMINOPHEN 5-325 MG PO TABS
1.0000 | ORAL_TABLET | ORAL | Status: DC | PRN
  Administered 2024-07-22 – 2024-07-24 (×2): 1 via ORAL
  Filled 2024-07-20 (×2): qty 1

## 2024-07-20 MED ORDER — ENOXAPARIN SODIUM 40 MG/0.4ML IJ SOSY
40.0000 mg | PREFILLED_SYRINGE | INTRAMUSCULAR | Status: DC
  Administered 2024-07-21 – 2024-07-25 (×5): 40 mg via SUBCUTANEOUS
  Filled 2024-07-20 (×5): qty 0.4

## 2024-07-20 MED ORDER — MORPHINE SULFATE (PF) 2 MG/ML IV SOLN
0.5000 mg | INTRAVENOUS | Status: DC | PRN
  Administered 2024-07-25: 0.5 mg via INTRAVENOUS
  Filled 2024-07-20: qty 1

## 2024-07-20 MED ORDER — IRBESARTAN 150 MG PO TABS
150.0000 mg | ORAL_TABLET | Freq: Every day | ORAL | Status: DC
  Administered 2024-07-20 – 2024-07-26 (×7): 150 mg via ORAL
  Filled 2024-07-20 (×7): qty 1

## 2024-07-20 MED ORDER — GABAPENTIN 300 MG PO CAPS
300.0000 mg | ORAL_CAPSULE | Freq: Every day | ORAL | Status: DC
  Administered 2024-07-20 – 2024-07-25 (×6): 300 mg via ORAL
  Filled 2024-07-20 (×6): qty 1

## 2024-07-20 MED ORDER — IBUPROFEN 400 MG PO TABS
400.0000 mg | ORAL_TABLET | Freq: Every morning | ORAL | Status: DC
  Administered 2024-07-21 – 2024-07-26 (×5): 400 mg via ORAL
  Filled 2024-07-20 (×6): qty 1

## 2024-07-20 MED ORDER — ACETAMINOPHEN 500 MG PO TABS
500.0000 mg | ORAL_TABLET | Freq: Four times a day (QID) | ORAL | Status: AC
  Administered 2024-07-20 – 2024-07-21 (×3): 500 mg via ORAL
  Filled 2024-07-20 (×3): qty 1

## 2024-07-20 MED ORDER — SODIUM CHLORIDE 0.9 % IV SOLN: INTRAVENOUS | Status: AC

## 2024-07-20 MED ORDER — DEXAMETHASONE SOD PHOSPHATE PF 10 MG/ML IJ SOLN
INTRAMUSCULAR | Status: DC | PRN
  Administered 2024-07-20: 10 mg via INTRAVENOUS

## 2024-07-20 MED ORDER — AMLODIPINE BESYLATE 5 MG PO TABS
5.0000 mg | ORAL_TABLET | Freq: Every day | ORAL | Status: DC
  Administered 2024-07-21 – 2024-07-26 (×6): 5 mg via ORAL
  Filled 2024-07-20 (×6): qty 1

## 2024-07-20 MED ORDER — PROPOFOL 10 MG/ML IV BOLUS
INTRAVENOUS | Status: AC
  Filled 2024-07-20: qty 40

## 2024-07-20 MED ORDER — CHLORHEXIDINE GLUCONATE 0.12 % MT SOLN
OROMUCOSAL | Status: AC
  Administered 2024-07-20: 15 mL
  Filled 2024-07-20: qty 15

## 2024-07-20 MED ORDER — FENTANYL CITRATE (PF) 100 MCG/2ML IJ SOLN
INTRAMUSCULAR | Status: DC | PRN
  Administered 2024-07-20 (×2): 50 ug via INTRAVENOUS

## 2024-07-20 NOTE — Progress Notes (Signed)
 Dr. Corinne aware of patient's elevated B/P.  Blood refusal form faxed to blood bank.

## 2024-07-20 NOTE — Op Note (Signed)
 07/20/2024  10:19 AM  PATIENT:  Brandon Flynn    PRE-OPERATIVE DIAGNOSIS:  Right Ankle Ulcer  POST-OPERATIVE DIAGNOSIS:  Same  PROCEDURE: Excisional debridement lateral right ankle ulcer.  Change excision of skin soft tissue muscle fascia and tendon. Application of biologic tissue graft with 7 x 10 sheet of Kerecis and 38 cm of Kerecis. Application of vancomycin  powder 1 g. Tissue sent for cultures. VAC peel in place large  SURGEON:  Jerona LULLA Sage, MD  PHYSICIAN ASSISTANT:None ANESTHESIA:   General  PREOPERATIVE INDICATIONS:  [name] is a  59 y.o. male with a diagnosis of Right Ankle Ulcer who failed conservative measures and elected for surgical management.    The risks benefits and alternatives were discussed with the patient preoperatively including but not limited to the risks of infection, bleeding, nerve injury, cardiopulmonary complications, the need for revision surgery, among others, and the patient was willing to proceed.  OPERATIVE IMPLANTS:   * No implants in log *  @ENCIMAGES @  OPERATIVE FINDINGS: Tissue had good petechial bleeding no deep tunneling.  There was exposed necrotic Achilles tendon.  Tissue was sent for cultures.  OPERATIVE PROCEDURE: Patient was brought to the operating room and underwent a general anesthetic.  After adequate levels anesthesia were obtained patient's right lower extremity was prepped using DuraPrep draped into a sterile field a timeout was called.  A 21 blade knife was used to excise skin soft tissue muscle fascia and partial Achilles tendon.  Tissue margins were clear and healthy.  The tissue was sent for cultures.  The wound was irrigated with Vashe.  The wound measures 15 x 10 cm.  1 g vancomycin  powder with 38 cm of Kerecis micro graft covered with a 7 x 10 sheet of Kerecis was applied to the wound bed.  This was secured with a wound VAC peel in place sponge.  This had a good suction fit patient was taken the PACU  in stable condition.   DISCHARGE PLANNING:  Antibiotic duration: Continue antibiotics based on tissue cultures  Weightbearing: Weightbearing as tolerated  Pain medication: Opioid pathway  Dressing care/ Wound VAC: Wound VAC  Ambulatory devices: Crutches or walker  Discharge to: Anticipate return to surgery on Wednesday.  Follow-up: In the office 1 week post operative.

## 2024-07-20 NOTE — Anesthesia Preprocedure Evaluation (Addendum)
"                                    Anesthesia Evaluation  Patient identified by MRN, date of birth, ID band Patient awake    Reviewed: Allergy & Precautions, NPO status , Patient's Chart, lab work & pertinent test results  Airway Mallampati: II  TM Distance: >3 FB Neck ROM: Full    Dental  (+) Teeth Intact, Dental Advisory Given   Pulmonary neg pulmonary ROS   Pulmonary exam normal breath sounds clear to auscultation       Cardiovascular hypertension, Normal cardiovascular exam Rhythm:Regular Rate:Normal     Neuro/Psych negative neurological ROS  negative psych ROS   GI/Hepatic negative GI ROS, Neg liver ROS,,,  Endo/Other  Obesity   Renal/GU Renal InsufficiencyRenal disease     Musculoskeletal negative musculoskeletal ROS (+)    Abdominal   Peds  Hematology  (+) Blood dyscrasia, anemia   Anesthesia Other Findings Day of surgery medications reviewed with the patient.  Reproductive/Obstetrics                              Anesthesia Physical Anesthesia Plan  ASA: 2  Anesthesia Plan: General   Post-op Pain Management: Tylenol  PO (pre-op)* and Toradol IV (intra-op)*   Induction: Intravenous  PONV Risk Score and Plan: 2 and Midazolam , Dexamethasone  and Ondansetron   Airway Management Planned: LMA  Additional Equipment:   Intra-op Plan:   Post-operative Plan: Extubation in OR  Informed Consent: I have reviewed the patients History and Physical, chart, labs and discussed the procedure including the risks, benefits and alternatives for the proposed anesthesia with the patient or authorized representative who has indicated his/her understanding and acceptance.     Dental advisory given  Plan Discussed with: CRNA  Anesthesia Plan Comments:          Anesthesia Quick Evaluation  "

## 2024-07-20 NOTE — Anesthesia Procedure Notes (Signed)
 Procedure Name: LMA Insertion Date/Time: 07/20/2024 9:44 AM  Performed by: Genny Gun, CRNAPre-anesthesia Checklist: Patient identified, Emergency Drugs available, Suction available, Timeout performed and Patient being monitored Patient Re-evaluated:Patient Re-evaluated prior to induction Oxygen Delivery Method: Circle system utilized Preoxygenation: Pre-oxygenation with 100% oxygen Induction Type: IV induction LMA: LMA inserted LMA Size: 4.0 Number of attempts: 1 Placement Confirmation: positive ETCO2 and breath sounds checked- equal and bilateral Tube secured with: Tape

## 2024-07-20 NOTE — Anesthesia Postprocedure Evaluation (Signed)
"   Anesthesia Post Note  Patient: Brandon Flynn  Procedure(s) Performed: INCISION AND DRAINAGE OF DEEP ABSCESS, ANKLE (Right: Ankle) APPLICATION, ALLOGRAFT, SKIN (Right: Ankle)     Patient location during evaluation: PACU Anesthesia Type: General Level of consciousness: awake and alert Pain management: pain level controlled Vital Signs Assessment: post-procedure vital signs reviewed and stable Respiratory status: spontaneous breathing, nonlabored ventilation and respiratory function stable Cardiovascular status: blood pressure returned to baseline and stable Postop Assessment: no apparent nausea or vomiting Anesthetic complications: no   No notable events documented.  Last Vitals:  Vitals:   07/20/24 1300 07/20/24 1333  BP: (!) 135/93 (!) 162/92  Pulse: 90 (!) 110  Resp: 14 18  Temp:  37.1 C  SpO2: 95% 100%    Last Pain:  Vitals:   07/20/24 1411  TempSrc:   PainSc: 7                  Garnette FORBES Skillern      "

## 2024-07-20 NOTE — Telephone Encounter (Signed)
 Signed handicap parking application mailed to patient in envelope he provided

## 2024-07-20 NOTE — Interval H&P Note (Signed)
 History and Physical Interval Note:  07/20/2024 7:04 AM  Brandon Flynn  has presented today for surgery, with the diagnosis of Right Ankle Ulcer.  The various methods of treatment have been discussed with the patient and family. After consideration of risks, benefits and other options for treatment, the patient has consented to  Procedures with comments: INCISION AND DRAINAGE OF DEEP ABSCESS, ANKLE (Right) - RIGHT ANKLE DEBRIDEMENT AND TISSUE GRAFT APPLICATION, ALLOGRAFT, SKIN (Right) as a surgical intervention.  The patient's history has been reviewed, patient examined, no change in status, stable for surgery.  I have reviewed the patient's chart and labs.  Questions were answered to the patient's satisfaction.     Trig Mcbryar V Yee Joss

## 2024-07-20 NOTE — Transfer of Care (Signed)
 Immediate Anesthesia Transfer of Care Note  Patient: Brandon Flynn  Procedure(s) Performed: INCISION AND DRAINAGE OF DEEP ABSCESS, ANKLE (Right) APPLICATION, ALLOGRAFT, SKIN (Right)  Patient Location: PACU  Anesthesia Type:General  Level of Consciousness: oriented, drowsy, and patient cooperative  Airway & Oxygen Therapy: Patient Spontanous Breathing and Patient connected to face mask oxygen  Post-op Assessment: Report given to RN and Post -op Vital signs reviewed and stable  Post vital signs: Reviewed and stable  Last Vitals:  Vitals Value Taken Time  BP 174/104 07/20/24 10:23  Temp    Pulse 93 07/20/24 10:25  Resp 23 07/20/24 10:25  SpO2 97 % 07/20/24 10:25  Vitals shown include unfiled device data.  Last Pain:  Vitals:   07/20/24 0752  TempSrc:   PainSc: 4       Patients Stated Pain Goal: 3 (07/20/24 0752)  Complications: No notable events documented.

## 2024-07-21 DIAGNOSIS — Z79899 Other long term (current) drug therapy: Secondary | ICD-10-CM | POA: Diagnosis not present

## 2024-07-21 DIAGNOSIS — A499 Bacterial infection, unspecified: Secondary | ICD-10-CM | POA: Diagnosis not present

## 2024-07-21 DIAGNOSIS — L089 Local infection of the skin and subcutaneous tissue, unspecified: Secondary | ICD-10-CM | POA: Diagnosis not present

## 2024-07-21 DIAGNOSIS — B965 Pseudomonas (aeruginosa) (mallei) (pseudomallei) as the cause of diseases classified elsewhere: Secondary | ICD-10-CM

## 2024-07-21 DIAGNOSIS — Z1624 Resistance to multiple antibiotics: Secondary | ICD-10-CM | POA: Diagnosis not present

## 2024-07-21 DIAGNOSIS — G629 Polyneuropathy, unspecified: Secondary | ICD-10-CM | POA: Diagnosis present

## 2024-07-21 DIAGNOSIS — L97319 Non-pressure chronic ulcer of right ankle with unspecified severity: Secondary | ICD-10-CM | POA: Diagnosis present

## 2024-07-21 DIAGNOSIS — W57XXXD Bitten or stung by nonvenomous insect and other nonvenomous arthropods, subsequent encounter: Secondary | ICD-10-CM | POA: Diagnosis not present

## 2024-07-21 DIAGNOSIS — T148XXA Other injury of unspecified body region, initial encounter: Secondary | ICD-10-CM | POA: Diagnosis not present

## 2024-07-21 DIAGNOSIS — B957 Other staphylococcus as the cause of diseases classified elsewhere: Secondary | ICD-10-CM | POA: Diagnosis not present

## 2024-07-21 DIAGNOSIS — S91001D Unspecified open wound, right ankle, subsequent encounter: Secondary | ICD-10-CM

## 2024-07-21 DIAGNOSIS — S90561D Insect bite (nonvenomous), right ankle, subsequent encounter: Secondary | ICD-10-CM | POA: Diagnosis not present

## 2024-07-21 DIAGNOSIS — L02415 Cutaneous abscess of right lower limb: Secondary | ICD-10-CM | POA: Diagnosis present

## 2024-07-21 DIAGNOSIS — A498 Other bacterial infections of unspecified site: Secondary | ICD-10-CM | POA: Insufficient documentation

## 2024-07-21 DIAGNOSIS — I129 Hypertensive chronic kidney disease with stage 1 through stage 4 chronic kidney disease, or unspecified chronic kidney disease: Secondary | ICD-10-CM | POA: Diagnosis present

## 2024-07-21 DIAGNOSIS — Z1611 Resistance to penicillins: Secondary | ICD-10-CM | POA: Diagnosis present

## 2024-07-21 DIAGNOSIS — N1831 Chronic kidney disease, stage 3a: Secondary | ICD-10-CM | POA: Diagnosis present

## 2024-07-21 DIAGNOSIS — Z882 Allergy status to sulfonamides status: Secondary | ICD-10-CM | POA: Diagnosis not present

## 2024-07-21 DIAGNOSIS — S91001A Unspecified open wound, right ankle, initial encounter: Secondary | ICD-10-CM | POA: Diagnosis not present

## 2024-07-21 DIAGNOSIS — G8929 Other chronic pain: Secondary | ICD-10-CM | POA: Diagnosis present

## 2024-07-21 MED ORDER — PIPERACILLIN-TAZOBACTAM 3.375 G IVPB
3.3750 g | Freq: Three times a day (TID) | INTRAVENOUS | Status: DC
  Administered 2024-07-21 – 2024-07-22 (×3): 3.375 g via INTRAVENOUS
  Filled 2024-07-21 (×3): qty 50

## 2024-07-21 MED ORDER — SODIUM CHLORIDE 0.9 % IV SOLN
1.0000 g | Freq: Three times a day (TID) | INTRAVENOUS | Status: DC
  Administered 2024-07-21: 1 g via INTRAVENOUS
  Filled 2024-07-21 (×3): qty 20

## 2024-07-21 NOTE — Plan of Care (Signed)
  Problem: Nutrition: Goal: Adequate nutrition will be maintained Outcome: Progressing   Problem: Coping: Goal: Level of anxiety will decrease Outcome: Progressing   Problem: Pain Managment: Goal: General experience of comfort will improve and/or be controlled Outcome: Progressing   Problem: Safety: Goal: Ability to remain free from injury will improve Outcome: Progressing

## 2024-07-21 NOTE — Progress Notes (Signed)
 Patient ID: Brandon Flynn, male   DOB: [birth year], 59 y.o.   MRN: [id number] Patient is a postoperative day 1 repeat debridement lateral ankle ulcer.  Cultures are showing gram-negative rods.  Previous cultures were positive for Pseudomonas most likely the same.  Patient was on 2 antibiotics for the Pseudomonas previously and after consultation with pharmacy he was started on meropenem  1 g every 8 hours.

## 2024-07-21 NOTE — Plan of Care (Signed)
   Problem: Education: Goal: Knowledge of the prescribed therapeutic regimen will improve Outcome: Progressing

## 2024-07-21 NOTE — Consult Note (Addendum)
 "    [facility]    Date of Admission:  07/20/2024     Total days of antibiotics 2               Reason for Consult: Lateral ankle ulcer    Referring Provider: Dr. Harden  Primary Care Provider: Esmeralda Ewings, Anya, MD   ASSESSMENT:  Brandon Flynn is 59 y/o male with recent history of chronic, polymicrobial non-healing lateral ankle wound presenting with enlarging wound and worsening neuropathic pain admitted for debridement and application of grafting and found to have surgical cultures positive for Pseudomonas aeruginosa which is concordant with previous cultures.  POD #1 with wound vac in place and tolerating antibiotics with no adverse side effects. Discussed plan of care to change antibiotics from meropenem  to piperacillin -tazobactam. Does not appear there is growth with previous MRSE, Klebsiella or Dipthroids at present and will continue to monitor cultures for organisms and adjust antibiotics as appropriate. Continue increased protein intake to aid in healing. Post-operative wound care per Orthopedics. Standard/universal precautions. Remaining medical and supportive care per Primary Team.   PLAN:  Change antibiotics to piperacillin -tazobactam.  Monitor cultures and await sensitivities of Pseudomonas  Post-operative wound care per Orthopedics. Continue high protein intake to aid in healing. Standard/universal precautions. Remaining medical and supportive care per primary team.    Principal Problem:   Infected wound Active Problems:   Pseudomonas infection    amLODipine   5 mg Oral Daily   vitamin C   1,000 mg Oral Daily   docusate sodium   100 mg Oral BID   enoxaparin  (LOVENOX ) injection  40 mg Subcutaneous Q24H   gabapentin   300 mg Oral QHS   ibuprofen   400 mg Oral q AM   irbesartan   150 mg Oral Daily     HPI: Brandon Flynn is a 59 y.o. male with previous medical history of hypertension, chronic kidney disease 3a, and chronic ankle  ulcer presenting to the hospital for repeat debridement.   Brandon Flynn is known to the ID service last seen by Dr. Dea for treatment of polymicrobial non-healing lateral ankle wound with cultures showing Staphylococcus epidermidis (MRSE), dipthroids (corynebacterium species) and and Pseudomonas aeruginosa. Following debridement plan was for 2 weeks of linezolid , ciprofloxacin  and metronidazole . Plan was to complete antibiotics as prescribed and continue with wound care per Orthopedics.  Followed up with Orthopedics on 07/17/24 and found to have enlarging right ankle ulcer with significant neuropathic pain and no clear evidence of infection with anticipated tissue graft and antibiotics to promote wound healing and reduce pain. Admitted on 07/20/24 and brought to the OR for excisional debridement and application of biological tissue graft. OR findings with tissue margins clear and healthy with findings of exposed necrotic Achilles tendon. Surgical specimens with gram stain showing gram negative rods and cultures with Pseudomonas aeruginosa.   Brandon Flynn is POD #1 and has remained afebrile and with WBC count of 13,200. Currently receiving meropenem . ID has been asked for antibiotic recommendations.   Review of Systems: Review of Systems  Constitutional:  Negative for chills, fever and weight loss.  Respiratory:  Negative for cough, shortness of breath and wheezing.   Cardiovascular:  Negative for chest pain and leg swelling.  Gastrointestinal:  Negative for abdominal pain, constipation, diarrhea, nausea and vomiting.  Skin:  Negative for rash.     Past Medical History:  Diagnosis Date   Hypertension     Social History[1]  History reviewed. No pertinent family history.  Allergies[2]  OBJECTIVE: Blood pressure (!) 150/91, pulse 82, temperature 98.6 F (37 C), temperature source Oral, resp. rate 16, height 5' 10 (1.778 m), weight 97.5 kg, SpO2 96%.  Physical  Exam Constitutional:      General: He is not in acute distress.    Appearance: He is well-developed.  Cardiovascular:     Rate and Rhythm: Normal rate and regular rhythm.     Heart sounds: Normal heart sounds.  Pulmonary:     Effort: Pulmonary effort is normal.     Breath sounds: Normal breath sounds.  Musculoskeletal:     Comments: Surgical dressing with wound vac in place.  Skin:    General: Skin is warm and dry.  Neurological:     Mental Status: He is alert.     Lab Results Lab Results  Component Value Date   WBC 13.2 (H) 07/20/2024   HGB 12.6 (L) 07/20/2024   HCT 37.4 (L) 07/20/2024   MCV 81.1 07/20/2024   PLT 310 07/20/2024    Lab Results  Component Value Date   CREATININE 1.31 (H) 07/20/2024   BUN 14 07/20/2024   NA 137 07/20/2024   K 3.5 07/20/2024   CL 103 07/20/2024   CO2 22 07/20/2024    Lab Results  Component Value Date   ALT 31 07/20/2024   AST 23 07/20/2024   ALKPHOS 53 07/20/2024   BILITOT 0.5 07/20/2024     Microbiology: Recent Results (from the past 240 hours)  Aerobic/Anaerobic Culture w Gram Stain (surgical/deep wound)     Status: None (Preliminary result)   Collection Time: 07/20/24 10:03 AM   Specimen: Soft Tissue, Other  Result Value Ref Range Status   Specimen Description TISSUE  Final   Special Requests RT ANKLE  Final   Gram Stain NO WBC SEEN ABUNDANT GRAM NEGATIVE RODS   Final   Culture   Final    ABUNDANT PSEUDOMONAS AERUGINOSA SUSCEPTIBILITIES TO FOLLOW CULTURE REINCUBATED FOR BETTER GROWTH Performed at [facility], [address] [place], [place] [address]    Report Status PENDING  Incomplete   I personally spent a total of 28 minutes in the care of the patient today including preparing to see the patient, getting/reviewing separately obtained history, performing a medically appropriate exam/evaluation, counseling and educating, placing orders, documenting clinical information in the EHR, independently interpreting  results, communicating results, and coordinating care.   Greg Britaney Espaillat, NP [facility]  07/21/2024  3:15 PM     [1]  Social History Tobacco Use   Smoking status: Never   Smokeless tobacco: Never  Vaping Use   Vaping status: Never Used  Substance Use Topics   Alcohol use: Yes    Alcohol/week: 5.0 standard drinks of alcohol    Types: 5 Standard drinks or equivalent per week   Drug use: Not Currently  [2]  Allergies Allergen Reactions   Sulfa Antibiotics Itching   "

## 2024-07-21 NOTE — Evaluation (Addendum)
 Physical Therapy Evaluation Patient Details Name: Brandon Flynn MRN: [id number] DOB: [birth year] Today's Date: 07/21/2024  History of Present Illness  Pt is a 59 year old male admitted for repeat R ankle I&D on 1/16. Plan is for another I&D on 1/21. PMH: Previous I&D on 12/12, HTN  Clinical Impression  Pt presents with admitting diagnosis above. Pt today was able to ambulate with RW CGA. Pt noted with R dorsiflexion contracture as pt has been NWB for the last 5 weeks. Educated on prolonged stretching using gait belt and standing weight shifts. Recommend HHPT upon DC. PT will continue to follow.         If plan is discharge home, recommend the following: A little help with walking and/or transfers;A little help with bathing/dressing/bathroom;Assist for transportation;Help with stairs or ramp for entrance   Can travel by private vehicle        Equipment Recommendations None recommended by PT  Recommendations for Other Services       Functional Status Assessment Patient has had a recent decline in their functional status and demonstrates the ability to make significant improvements in function in a reasonable and predictable amount of time.     Precautions / Restrictions Precautions Precautions: Fall;Other (comment) Recall of Precautions/Restrictions: Intact Precaution/Restrictions Comments: wound vac Restrictions Weight Bearing Restrictions Per Provider Order: No      Mobility  Bed Mobility Overal bed mobility: Modified Independent                  Transfers Overall transfer level: Modified independent Equipment used: Rolling walker (2 wheels)               General transfer comment: Good hand placement    Ambulation/Gait Ambulation/Gait assistance: Supervision Gait Distance (Feet): 45 Feet Assistive device: Rolling walker (2 wheels) Gait Pattern/deviations: Antalgic, Decreased weight shift to right, Knee flexed in stance - right, Step-through  pattern, Step-to pattern, Decreased stride length Gait velocity: decreased   Pre-gait activities: Standing weight shifts x15 General Gait Details: Pt ambulated with mostly with a PWB status as pt was hesistant to place full weight on R heel. Pt also noted with R dorsiflexion contracture. Antalgic gait pattern mostly. no LOB noted.  Stairs            Wheelchair Mobility     Tilt Bed    Modified Rankin (Stroke Patients Only)       Balance Overall balance assessment: Needs assistance Sitting-balance support: Bilateral upper extremity supported, Feet supported Sitting balance-Leahy Scale: Good     Standing balance support: Bilateral upper extremity supported, During functional activity, Reliant on assistive device for balance Standing balance-Leahy Scale: Poor Standing balance comment: Reliant on RW                             Pertinent Vitals/Pain Pain Assessment Pain Assessment: 0-10 Pain Score: 3  Pain Location: R heel Pain Descriptors / Indicators: Burning, Sore Pain Intervention(s): Monitored during session, Limited activity within patient's tolerance, Premedicated before session, Repositioned    Home Living Family/patient expects to be discharged to:: Private residence Living Arrangements: Spouse/significant other;Children Available Help at Discharge: Family Type of Home: House Home Access: Stairs to enter Entrance Stairs-Rails: Left (R half wall) Entrance Stairs-Number of Steps: 13   Home Layout: Other (Comment);Able to live on main level with bedroom/bathroom (Split level) Home Equipment: Crutches;Toilet riser      Prior Function Prior Level of Function : Independent/Modified Independent  Mobility Comments: Pt has been Mod I with knee scooter due to NWB status ADLs Comments: mod I     Extremity/Trunk Assessment   Upper Extremity Assessment Upper Extremity Assessment: Overall WFL for tasks assessed    Lower Extremity  Assessment Lower Extremity Assessment: RLE deficits/detail RLE Deficits / Details: R ankle ulcer with wound vac    Cervical / Trunk Assessment Cervical / Trunk Assessment: Normal  Communication   Communication Communication: No apparent difficulties    Cognition Arousal: Alert Behavior During Therapy: WFL for tasks assessed/performed   PT - Cognitive impairments: No apparent impairments                         Following commands: Intact       Cueing Cueing Techniques: Verbal cues     General Comments General comments (skin integrity, edema, etc.): Extensive education on proper dosing and prevention of overuse with exercises.    Exercises Other Exercises Other Exercises: Gait belt given for calf stretch x5 30 seconds each. Other Exercises: Standing weight shifts x15   Assessment/Plan    PT Assessment Patient needs continued PT services  PT Problem List Decreased strength;Decreased range of motion;Decreased balance;Decreased activity tolerance;Decreased mobility;Decreased coordination;Decreased knowledge of use of DME;Decreased knowledge of precautions;Decreased safety awareness;Cardiopulmonary status limiting activity       PT Treatment Interventions DME instruction;Gait training;Stair training;Functional mobility training;Therapeutic activities;Therapeutic exercise;Balance training;Neuromuscular re-education;Cognitive remediation;Patient/family education    PT Goals (Current goals can be found in the Care Plan section)  Acute Rehab PT Goals Patient Stated Goal: to go home PT Goal Formulation: With patient Time For Goal Achievement: 08/04/24 Potential to Achieve Goals: Good    Frequency Min 2X/week     Co-evaluation               AM-PAC PT 6 Clicks Mobility  Outcome Measure Help needed turning from your back to your side while in a flat bed without using bedrails?: None Help needed moving from lying on your back to sitting on the side of a  flat bed without using bedrails?: None Help needed moving to and from a bed to a chair (including a wheelchair)?: A Little Help needed standing up from a chair using your arms (e.g., wheelchair or bedside chair)?: None Help needed to walk in hospital room?: A Little Help needed climbing 3-5 steps with a railing? : A Lot 6 Click Score: 20    End of Session Equipment Utilized During Treatment: Gait belt Activity Tolerance: Patient tolerated treatment well Patient left: in bed;with call bell/phone within reach Nurse Communication: Mobility status PT Visit Diagnosis: Other abnormalities of gait and mobility (R26.89)    Time: 1330-1416 PT Time Calculation (min) (ACUTE ONLY): 46 min   Charges:   PT Evaluation $PT Eval Moderate Complexity: 1 Mod PT Treatments $Gait Training: 8-22 mins $Therapeutic Activity: 8-22 mins PT General Charges $$ ACUTE PT VISIT: 1 Visit         Sueellen NOVAK, PT, DPT Acute Rehab Services [phone]   Vishwa Dais 07/21/2024, 4:42 PM

## 2024-07-22 MED ORDER — SODIUM CHLORIDE 0.9 % IV SOLN
1.0000 g | Freq: Three times a day (TID) | INTRAVENOUS | Status: DC
  Administered 2024-07-22 – 2024-07-23 (×3): 1 g via INTRAVENOUS
  Filled 2024-07-22 (×4): qty 20

## 2024-07-22 NOTE — Plan of Care (Signed)
" °  Problem: Bowel/Gastric: Goal: Gastrointestinal status for postoperative course will improve Outcome: Progressing   Problem: Nutritional: Goal: Will attain and maintain optimal nutritional status Outcome: Progressing   Problem: Activity: Goal: Risk for activity intolerance will decrease Outcome: Progressing   Problem: Coping: Goal: Level of anxiety will decrease Outcome: Progressing   Problem: Pain Managment: Goal: General experience of comfort will improve and/or be controlled Outcome: Progressing   Problem: Safety: Goal: Ability to remain free from injury will improve Outcome: Progressing   "

## 2024-07-22 NOTE — Progress Notes (Signed)
 Mobility Specialist Progress Note:    07/22/24 1041  Mobility  Activity Ambulated with assistance (In hallway)  Level of Assistance Standby assist, set-up cues, supervision of patient - no hands on  Assistive Device Front wheel walker  Distance Ambulated (ft) 80 ft  Activity Response Tolerated well  Mobility Referral Yes  Mobility visit 1 Mobility  Mobility Specialist Start Time (ACUTE ONLY) 1022  Mobility Specialist Stop Time (ACUTE ONLY) 1041  Mobility Specialist Time Calculation (min) (ACUTE ONLY) 19 min   Received pt in bed and agreeable to mobility. No physical assistance required. Pt c/o slight RLE pain, otherwise tolerated well. Returned to room without fault. Left pt EOB with personal belongings and call light within reach. All needs met.  Lavanda Pollack Mobility Specialist  Please contact via [employer] or  Rehab Office [phone]

## 2024-07-22 NOTE — Progress Notes (Signed)
 Subjective: 2 Days Post-Op Procedures (LRB): INCISION AND DRAINAGE OF DEEP ABSCESS, ANKLE (Right) APPLICATION, ALLOGRAFT, SKIN (Right) Patient reports pain as moderate.   Cultures show Pseudomonas.  Objective: Vital signs in last 24 hours: Temp:  [97.7 F (36.5 C)-98.6 F (37 C)] 97.9 F (36.6 C) (01/18 0851) Pulse Rate:  [64-82] 71 (01/18 0851) Resp:  [16-17] 16 (01/18 0851) BP: (150-162)/(90-108) 162/108 (01/18 0855) SpO2:  [96 %-99 %] 98 % (01/18 0851)  Intake/Output from previous day: 01/17 0701 - 01/18 0700 In: 340.7 [P.O.:240; I.V.:0.7; IV Piggyback:100] Out: 1350 [Urine:1350] Intake/Output this shift: No intake/output data recorded.  Recent Labs    07/20/24 0702  HGB 12.6*   Recent Labs    07/20/24 0702  WBC 13.2*  RBC 4.61  HCT 37.4*  PLT 310   Recent Labs    07/20/24 0702  NA 137  K 3.5  CL 103  CO2 22  BUN 14  CREATININE 1.31*  GLUCOSE 108*  CALCIUM 9.5   No results for input(s): LABPT, INR in the last 72 hours.  Wound vac in place actively draining. Dorsi/plantar flexion intact right ankle Calf supple and non tender   Assessment/Plan: 2 Days Post-Op Procedures (LRB): INCISION AND DRAINAGE OF DEEP ABSCESS, ANKLE (Right) APPLICATION, ALLOGRAFT, SKIN (Right) Up with therapy Will discuss culture results with Dr. Harden BERTRUM GASKINS 07/22/2024, 10:54 AM

## 2024-07-22 NOTE — Plan of Care (Signed)
" °  Problem: Education: Goal: Knowledge of the prescribed therapeutic regimen will improve Outcome: Progressing   Problem: Bowel/Gastric: Goal: Gastrointestinal status for postoperative course will improve Outcome: Progressing   Problem: Cardiac: Goal: Ability to maintain an adequate cardiac output Outcome: Progressing Goal: Will show no evidence of cardiac arrhythmias Outcome: Progressing   Problem: Nutritional: Goal: Will attain and maintain optimal nutritional status Outcome: Progressing   Problem: Neurological: Goal: Will regain or maintain usual level of consciousness Outcome: Progressing   "

## 2024-07-23 ENCOUNTER — Encounter (HOSPITAL_COMMUNITY): Payer: Self-pay | Admitting: Orthopedic Surgery

## 2024-07-23 ENCOUNTER — Other Ambulatory Visit: Payer: Self-pay

## 2024-07-23 DIAGNOSIS — B965 Pseudomonas (aeruginosa) (mallei) (pseudomallei) as the cause of diseases classified elsewhere: Secondary | ICD-10-CM | POA: Diagnosis not present

## 2024-07-23 DIAGNOSIS — B957 Other staphylococcus as the cause of diseases classified elsewhere: Secondary | ICD-10-CM

## 2024-07-23 DIAGNOSIS — S91001D Unspecified open wound, right ankle, subsequent encounter: Secondary | ICD-10-CM | POA: Diagnosis not present

## 2024-07-23 MED ORDER — SODIUM CHLORIDE 0.9 % IV SOLN
2.0000 g | Freq: Three times a day (TID) | INTRAVENOUS | Status: DC
  Administered 2024-07-23 – 2024-07-26 (×7): 2 g via INTRAVENOUS
  Filled 2024-07-23 (×7): qty 12.5

## 2024-07-23 MED ORDER — LINEZOLID 600 MG PO TABS
600.0000 mg | ORAL_TABLET | Freq: Two times a day (BID) | ORAL | Status: DC
  Administered 2024-07-23 – 2024-07-26 (×5): 600 mg via ORAL
  Filled 2024-07-23 (×5): qty 1

## 2024-07-23 MED ORDER — PIPERACILLIN-TAZOBACTAM 3.375 G IVPB
3.3750 g | Freq: Three times a day (TID) | INTRAVENOUS | Status: DC
  Administered 2024-07-23: 3.375 g via INTRAVENOUS
  Filled 2024-07-23: qty 50

## 2024-07-23 MED ORDER — SODIUM CHLORIDE 0.9% FLUSH
10.0000 mL | INTRAVENOUS | Status: DC | PRN
  Administered 2024-07-26: 10 mL

## 2024-07-23 MED ORDER — CHLORHEXIDINE GLUCONATE CLOTH 2 % EX PADS
6.0000 | MEDICATED_PAD | Freq: Every day | CUTANEOUS | Status: DC
  Administered 2024-07-23 – 2024-07-26 (×4): 6 via TOPICAL

## 2024-07-23 MED ORDER — METRONIDAZOLE 500 MG PO TABS
500.0000 mg | ORAL_TABLET | Freq: Two times a day (BID) | ORAL | Status: DC
  Administered 2024-07-23 – 2024-07-26 (×5): 500 mg via ORAL
  Filled 2024-07-23 (×5): qty 1

## 2024-07-23 NOTE — Progress Notes (Signed)
 "        [facility]  Date of Admission:  07/20/2024      Total days of antibiotics 2   Linezolid  1/19  Cefepime  1/19  Metronidazole            ASSESSMENT: Brandon Flynn is a 59 y.o. male admitted with:   Polymicrobial infection of chronic wound, Rt ankle - Initially started as a tick bite that developed a persistent scab that would recur despite using doxycycline and topical mupirocin. [place] and completed 10-d course of cipro  in November. Debrided first Nov 14th (Staph Epi, Diptheroids), Dec 10th (PsA, Kleb Aerogenes, Bacteroides spp) >> complicated by large hematoma with skin ischemia 2/2 HTN. 12/12 (healthy appearing wound, AFB negative). Completed 2 week course linezolid  + ciprofloxacin  + metronidazole  through 12/25.  Wound now is growing pseudomonas and staphyloccus hominis. Given history of zosyn  resistance in the past will switch to cefepime  (the MIC is 8 and viable option for now).  As much topical / surgical control as we can offer would be helpful to decrease bioburden - appreciate Dr. Harden with repeated OR evals.  - Cefepime  + Metronidazole  + Linezolid   - FU intra-op findings  - Place PICC line now as he will need IV to cover the pseudomonas   Chronic Non-Healing wound -  We have requested Dr. 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 Now showing FQ resistance   Medication monitoring -  Follow platelets on linezolid  if we need to continue this. Kidney function stable - follow for changes that would warrant adjustments in dose for antibiotics  - Check CBC in AM   Isolation Recommendations -  Standard / Universal    PLAN: - Cefepime  + Metronidazole  + Linezolid   - FU intra-op findings  - recommend tissue to pathology  for consideration of pyoderma  - CBC in AM  - Go ahead and place PICC line now  Principal Problem:   Infected wound Active Problems:   Pseudomonas infection   Polymicrobial bacterial infection    amLODipine   5 mg Oral Daily   vitamin C   1,000 mg Oral Daily   docusate sodium   100 mg Oral BID   enoxaparin  (LOVENOX ) injection  40 mg Subcutaneous Q24H   gabapentin   300 mg Oral QHS   ibuprofen   400 mg Oral q AM   irbesartan   150 mg Oral Daily    SUBJECTIVE: Feeling well. More surgery planned on Wednesday with Dr. Harden.  Went over timeline of events again and all bacterial cultures collected since November.  No s/e to antibiotics at present.   Review of Systems: Review of Systems  Constitutional:  Negative for fever.    Allergies[1]  OBJECTIVE: Vitals:   07/23/24 0049 07/23/24 0559 07/23/24 0908 07/23/24 0910  BP: (!) 148/90 (!) 164/100 (!) 145/103 (!) 145/103  Pulse: 66 69  80  Resp:  16  17  Temp: 99 F (37.2 C) 98.4 F (36.9 C)  98.2 F (36.8 C)  TempSrc: Oral Oral  Oral  SpO2: 100% 97%  99%  Weight:      Height:       Body mass index is 30.84 kg/m.  Physical Exam Constitutional:      Appearance: Normal appearance. He is not ill-appearing.  HENT:  Head: Normocephalic.     Mouth/Throat:     Mouth: Mucous membranes are moist.     Pharynx: Oropharynx is clear.  Eyes:     General: No scleral icterus. Cardiovascular:     Rate and Rhythm: Normal rate and regular rhythm.  Pulmonary:     Effort: Pulmonary effort is normal.  Musculoskeletal:        General: Normal range of motion.     Cervical back: Normal range of motion.     Comments: Ankle wrapped in clean / dry dressing  Skin:    Coloration: Skin is not jaundiced or pale.  Neurological:     Mental Status: He is alert and oriented to person, place, and time.  Psychiatric:        Mood and Affect: Mood normal.        Judgment: Judgment normal.     Lab Results Lab Results  Component Value Date    WBC 13.2 (H) 07/20/2024   HGB 12.6 (L) 07/20/2024   HCT 37.4 (L) 07/20/2024   MCV 81.1 07/20/2024   PLT 310 07/20/2024    Lab Results  Component Value Date   CREATININE 1.31 (H) 07/20/2024   BUN 14 07/20/2024   NA 137 07/20/2024   K 3.5 07/20/2024   CL 103 07/20/2024   CO2 22 07/20/2024    Lab Results  Component Value Date   ALT 31 07/20/2024   AST 23 07/20/2024   ALKPHOS 53 07/20/2024   BILITOT 0.5 07/20/2024     Microbiology: Recent Results (from the past 240 hours)  Aerobic/Anaerobic Culture w Gram Stain (surgical/deep wound)     Status: None (Preliminary result)   Collection Time: 07/20/24 10:03 AM   Specimen: Soft Tissue, Other  Result Value Ref Range Status   Specimen Description TISSUE  Final   Special Requests RT ANKLE  Final   Gram Stain   Final    NO WBC SEEN ABUNDANT GRAM NEGATIVE RODS Performed at [facility], [address], [place], [place] [address]    Culture   Final    ABUNDANT PSEUDOMONAS AERUGINOSA FEW STAPHYLOCOCCUS HAEMOLYTICUS SUSCEPTIBILITIES TO FOLLOW NO ANAEROBES ISOLATED; CULTURE IN PROGRESS FOR 5 DAYS    Report Status PENDING  Incomplete   Organism ID, Bacteria PSEUDOMONAS AERUGINOSA  Final      Susceptibility   Pseudomonas aeruginosa - MIC*    MEROPENEM  <=0.25 SENSITIVE Sensitive     CIPROFLOXACIN  2 RESISTANT Resistant     IMIPENEM 1 SENSITIVE Sensitive     PIP/TAZO Value in next row Sensitive      8 SENSITIVEThis is a modified FDA-approved test that has been validated and its performance characteristics determined by the reporting laboratory.  This laboratory is certified under the Clinical Laboratory Improvement Amendments CLIA as qualified to perform high complexity clinical laboratory testing.    CEFTAZIDIME/AVIBACTAM Value in next row Sensitive      8 SENSITIVEThis is a modified FDA-approved test that has been validated and its performance characteristics determined by the reporting laboratory.  This laboratory is certified  under the Clinical Laboratory Improvement Amendments CLIA as qualified to perform high complexity clinical laboratory testing.    CEFTOLOZANE/TAZOBACTAM Value in next row Sensitive      8 SENSITIVEThis is a modified FDA-approved test that has been validated and its performance characteristics determined by the reporting laboratory.  This laboratory is certified under the Clinical Laboratory Improvement Amendments CLIA as qualified to perform high complexity clinical laboratory testing.  TOBRAMYCIN  Value in next row Sensitive      8 SENSITIVEThis is a modified FDA-approved test that has been validated and its performance characteristics determined by the reporting laboratory.  This laboratory is certified under the Clinical Laboratory Improvement Amendments CLIA as qualified to perform high complexity clinical laboratory testing.    CEFTAZIDIME Value in next row Sensitive      8 SENSITIVEThis is a modified FDA-approved test that has been validated and its performance characteristics determined by the reporting laboratory.  This laboratory is certified under the Clinical Laboratory Improvement Amendments CLIA as qualified to perform high complexity clinical laboratory testing.    * ABUNDANT PSEUDOMONAS AERUGINOSA     Corean Fireman, MSN, NP-C [facility]  [email or web address] Pager: [phone] Office: [phone] [facility] Main Line: [phone] *Secure Chat Communication Welcome  Total Encounter Time: 25 min    [1]  Allergies Allergen Reactions   Sulfa Antibiotics Itching   "

## 2024-07-23 NOTE — Plan of Care (Signed)
" °  Problem: Education: Goal: Knowledge of the prescribed therapeutic regimen will improve Outcome: Progressing   Problem: Bowel/Gastric: Goal: Gastrointestinal status for postoperative course will improve Outcome: Progressing   Problem: Skin Integrity: Goal: Demonstrates signs of wound healing without infection Outcome: Progressing   Problem: Nutritional: Goal: Will attain and maintain optimal nutritional status Outcome: Completed/Met   "

## 2024-07-23 NOTE — Plan of Care (Signed)

## 2024-07-23 NOTE — Progress Notes (Signed)
 Patient ID: Brandon Flynn, male   DOB: 09/21/1965, 59 y.o.   MRN: [id number] Patient is seen in follow-up for chronic wound lateral right ankle.  He is postoperative day 3 from the most recent debridement.  Tissue cultures are showing Pseudomonas which is consistent with his previous cultures.  I tentatively placed patient on meropenem  Saturday morning and this was changed Zosyn  after infectious disease consult.  Will plan to return to the operating room on Wednesday for repeat debridement and tissue graft and anticipate topical application of tobramycin  powder 1.2 g.

## 2024-07-23 NOTE — TOC CM/SW Note (Signed)
 Transition of Care [facility]) - Inpatient Brief Assessment   Patient Details  Name: Brandon Flynn MRN: [id number] Date of Birth: 11-Nov-1965  Transition of Care [facility]) CM/SW Contact:    Brandon Flynn, Brandon Muskrat, RN Phone Number: 07/23/2024, 2:14 PM   Clinical Narrative:  Patient underwent I&D of Rt Ankle Abscess and Tissue Graft application by Ortho on 07/18/24 with Wound Vac placement.  Tissue Cx showing Pseudomonas. Plan to return to OR on Wednesday 07/2124 for repeat Debridement and Tissue Graft application. On NWB precautions at this time. Currently on IV abx, ID following.    CM spoke with patient and wife at bedside about needs for post hospital transition. Patient lives with his wife Brandon Flynn, son and daughter. Has seven children. Both parents and three sisters are supportive. Retired, modified independent, has a walker, 2 knee scooters and crutches.   PCP is Brandon Flynn, Damaris, MD and uses [facility] on [address] in [place].  No PT f/u, no ICM needs or recommendations noted at this time.  Patient not Medically ready for discharge.  CM will continue to follow as patient progresses with care towards discharge.       Transition of Care Asessment: Insurance and Status: Insurance coverage has been reviewed Patient has primary care physician: Yes Home environment has been reviewed: Yes Prior level of function:: Modified Independent Prior/Current Home Services: No current home services Social Drivers of Health Review: SDOH reviewed no interventions necessary Readmission risk has been reviewed: Yes Transition of care needs: no transition of care needs at this time

## 2024-07-23 NOTE — Progress Notes (Signed)
 Peripherally Inserted Central Catheter Placement  The IV Nurse has discussed with the patient and/or persons authorized to consent for the patient, the purpose of this procedure and the potential benefits and risks involved with this procedure.  The benefits include less needle sticks, lab draws from the catheter, and the patient may be discharged home with the catheter. Risks include, but not limited to, infection, bleeding, blood clot (thrombus formation), and puncture of an artery; nerve damage and irregular heartbeat and possibility to perform a PICC exchange if needed/ordered by physician.  Alternatives to this procedure were also discussed.  Bard Power PICC patient education guide, fact sheet on infection prevention and patient information card has been provided to patient /or left at bedside.    PICC Placement Documentation  PICC Single Lumen 07/23/24 Left Basilic 45 cm 0 cm (Active)  Indication for Insertion or Continuance of Line Prolonged intravenous therapies 07/23/24 1845  Exposed Catheter (cm) 0 cm 07/23/24 1845  Site Assessment Clean, Dry, Intact 07/23/24 1845  Line Status Flushed;Saline locked;Blood return noted 07/23/24 1845  Dressing Type Transparent;Securing device 07/23/24 1845  Dressing Status Antimicrobial disc/dressing in place;Clean, Dry, Intact 07/23/24 1845  Line Care Connections checked and tightened 07/23/24 1845  Line Adjustment (NICU/IV Team Only) No 07/23/24 1845  Dressing Intervention New dressing;Adhesive placed at insertion site (IV team only) 07/23/24 1845  Dressing Change Due 07/30/24 07/23/24 1845       Renaee Neville Skillern 07/23/2024, 6:57 PM

## 2024-07-23 NOTE — Progress Notes (Signed)
 Physical Therapy Treatment Patient Details Name: Brandon Flynn MRN: [id number] DOB: 06-09-1966 Today's Date: 07/23/2024   History of Present Illness Pt is a 58 year old male admitted for repeat R ankle I&D on 1/16. Plan is for another I&D on 1/21. PMH: Previous I&D on 12/12, HTN    PT Comments  Pt pleasant and motivated to participate in physical therapy session with seemingly good pain control. Pt reports having a conversation with MD Harden this morning regarding weightbearing precautions; pt reports MD stating he still would like to treat conservatively with largely RLE in NWB position, unless needed for balance. Pt able to participate in RLE open chain and LLE closed chain exercises and ambulating household distances with RW with RLE NWB. Provided with Medbridge HEP for strengthening and ROM. No follow up PT anticipated until pt is allowed to weightbear as tolerated.    If plan is discharge home, recommend the following: Assist for transportation   Can travel by private vehicle        Equipment Recommendations  None recommended by PT    Recommendations for Other Services       Precautions / Restrictions Precautions Precautions: Fall;Other (comment) Recall of Precautions/Restrictions: Intact Precaution/Restrictions Comments: wound vac Restrictions Weight Bearing Restrictions Per Provider Order: Yes RLE Weight Bearing Per Provider Order: Non weight bearing     Mobility  Bed Mobility               General bed mobility comments: OOB in chair    Transfers Overall transfer level: Modified independent Equipment used: Rolling walker (2 wheels)               General transfer comment: Good hand placemen    Ambulation/Gait Ambulation/Gait assistance: Modified independent (Device/Increase time) Gait Distance (Feet): 80 Feet Assistive device: Rolling walker (2 wheels) Gait Pattern/deviations: Step-to pattern Gait velocity: decreased Gait velocity  interpretation: <1.31 ft/sec, indicative of household ambulator   General Gait Details: Hop to pattern   Stairs             Wheelchair Mobility     Tilt Bed    Modified Rankin (Stroke Patients Only)       Balance Overall balance assessment: Needs assistance Sitting-balance support: Bilateral upper extremity supported, Feet supported Sitting balance-Leahy Scale: Good     Standing balance support: Bilateral upper extremity supported, During functional activity, Reliant on assistive device for balance Standing balance-Leahy Scale: Poor Standing balance comment: Reliant on RW                            Communication Communication Communication: No apparent difficulties  Cognition Arousal: Alert Behavior During Therapy: WFL for tasks assessed/performed   PT - Cognitive impairments: No apparent impairments                         Following commands: Intact      Cueing Cueing Techniques: Verbal cues  Exercises General Exercises - Lower Extremity Ankle Circles/Pumps: PROM, Right, Seated, Other (comment) (1 min hold) Long Arc Quad: AROM, Right, 10 reps, Seated Hip ABduction/ADduction: AROM, Right, 10 reps, Standing Hip Flexion/Marching: AROM, Right, 10 reps, Standing Heel Raises: Left, 15 reps, Standing Other Exercises Other Exercises: Standing: hip extension x 10    General Comments        Pertinent Vitals/Pain Pain Assessment Pain Assessment: Faces Faces Pain Scale: Hurts a little bit Pain Location: R heel Pain Descriptors /  Indicators: Burning, Sore Pain Intervention(s): Monitored during session, Limited activity within patient's tolerance    Home Living                          Prior Function            PT Goals (current goals can now be found in the care plan section) Acute Rehab PT Goals Patient Stated Goal: to go home PT Goal Formulation: With patient Time For Goal Achievement: 08/04/24 Potential to Achieve  Goals: Good Progress towards PT goals: Progressing toward goals    Frequency    Min 1X/week      PT Plan      Co-evaluation              AM-PAC PT 6 Clicks Mobility   Outcome Measure  Help needed turning from your back to your side while in a flat bed without using bedrails?: None Help needed moving from lying on your back to sitting on the side of a flat bed without using bedrails?: None Help needed moving to and from a bed to a chair (including a wheelchair)?: None Help needed standing up from a chair using your arms (e.g., wheelchair or bedside chair)?: None Help needed to walk in hospital room?: None Help needed climbing 3-5 steps with a railing? : A Little 6 Click Score: 23    End of Session Equipment Utilized During Treatment: Gait belt Activity Tolerance: Patient tolerated treatment well Patient left: with call bell/phone within reach;in chair Nurse Communication: Mobility status PT Visit Diagnosis: Other abnormalities of gait and mobility (R26.89)     Time: 8797-8762 PT Time Calculation (min) (ACUTE ONLY): 35 min  Charges:    $Therapeutic Exercise: 8-22 mins $Therapeutic Activity: 8-22 mins PT General Charges $$ ACUTE PT VISIT: 1 Visit                     Brandon Flynn, PT, DPT Acute Rehabilitation Services Office [phone]    Brandon Flynn 07/23/2024, 1:29 PM

## 2024-07-24 ENCOUNTER — Other Ambulatory Visit: Payer: Self-pay

## 2024-07-24 ENCOUNTER — Other Ambulatory Visit (HOSPITAL_COMMUNITY): Payer: Self-pay

## 2024-07-24 DIAGNOSIS — Z1624 Resistance to multiple antibiotics: Secondary | ICD-10-CM

## 2024-07-24 DIAGNOSIS — S90561D Insect bite (nonvenomous), right ankle, subsequent encounter: Secondary | ICD-10-CM | POA: Diagnosis not present

## 2024-07-24 DIAGNOSIS — L97319 Non-pressure chronic ulcer of right ankle with unspecified severity: Secondary | ICD-10-CM | POA: Diagnosis not present

## 2024-07-24 DIAGNOSIS — B965 Pseudomonas (aeruginosa) (mallei) (pseudomallei) as the cause of diseases classified elsewhere: Secondary | ICD-10-CM | POA: Diagnosis not present

## 2024-07-24 DIAGNOSIS — B957 Other staphylococcus as the cause of diseases classified elsewhere: Secondary | ICD-10-CM | POA: Diagnosis not present

## 2024-07-24 LAB — SURGICAL PCR SCREEN
MRSA, PCR: NEGATIVE
Staphylococcus aureus: NEGATIVE

## 2024-07-24 LAB — CBC WITH DIFFERENTIAL/PLATELET
Abs Immature Granulocytes: 0.04 K/uL (ref 0.00–0.07)
Basophils Absolute: 0 K/uL (ref 0.0–0.1)
Basophils Relative: 0 %
Eosinophils Absolute: 0.1 K/uL (ref 0.0–0.5)
Eosinophils Relative: 1 %
HCT: 33.1 % — ABNORMAL LOW (ref 39.0–52.0)
Hemoglobin: 11.1 g/dL — ABNORMAL LOW (ref 13.0–17.0)
Immature Granulocytes: 0 %
Lymphocytes Relative: 32 %
Lymphs Abs: 3.4 K/uL (ref 0.7–4.0)
MCH: 27.8 pg (ref 26.0–34.0)
MCHC: 33.5 g/dL (ref 30.0–36.0)
MCV: 82.8 fL (ref 80.0–100.0)
Monocytes Absolute: 0.8 K/uL (ref 0.1–1.0)
Monocytes Relative: 8 %
Neutro Abs: 6.1 K/uL (ref 1.7–7.7)
Neutrophils Relative %: 59 %
Platelets: 279 K/uL (ref 150–400)
RBC: 4 MIL/uL — ABNORMAL LOW (ref 4.22–5.81)
RDW: 14.4 % (ref 11.5–15.5)
WBC: 10.5 K/uL (ref 4.0–10.5)
nRBC: 0 % (ref 0.0–0.2)

## 2024-07-24 MED ORDER — LINEZOLID 600 MG PO TABS
600.0000 mg | ORAL_TABLET | Freq: Two times a day (BID) | ORAL | 0 refills | Status: AC
  Filled 2024-07-24: qty 20, 10d supply, fill #0

## 2024-07-24 MED ORDER — POVIDONE-IODINE 10 % EX SWAB
2.0000 | Freq: Once | CUTANEOUS | Status: AC
  Administered 2024-07-25: 2 via TOPICAL

## 2024-07-24 MED ORDER — CHLORHEXIDINE GLUCONATE 4 % EX SOLN
60.0000 mL | Freq: Once | CUTANEOUS | Status: DC
  Filled 2024-07-24: qty 60

## 2024-07-24 MED ORDER — METRONIDAZOLE 500 MG PO TABS
500.0000 mg | ORAL_TABLET | Freq: Two times a day (BID) | ORAL | 0 refills | Status: AC
  Filled 2024-07-24: qty 58, 29d supply, fill #0

## 2024-07-24 NOTE — TOC Initial Note (Signed)
 Transition of Care (TOC) - Initial/Assessment Note   Spoke to patient and wife at bedside. Wife is an ICU nurse and is arranging taking family medical leave.   Plan to discharge to home Thursday on IV ABX.   Explained to patient and wife, [employer] will be infusion company and ship antibiotics to their home. Pam works with [employer] and she will come to patient's hospital room and provide education on giving ABX at home. They will have a HHRN to draw labs and change PICC dressing. HHRN will not be present every time a dose is due. Wife and patient voiced understanding.   Pam with [employer] aware and will call patient and wife shortly  Patient Details  Name: Brandon Flynn MRN: [id number] Date of Birth: June 05, 1966  Transition [facility]) CM/SW Contact:    Stephane Powell Jansky, RN Phone Number: 07/24/2024, 12:09 PM  Clinical Narrative:                   Expected Discharge Plan: Home w Home Health Services Barriers to Discharge: Continued Medical Work up   Patient Goals and CMS Choice Patient states their goals for this hospitalization and ongoing recovery are:: to return to home CMS Medicare.gov Compare Post Acute Care list provided to:: Patient Choice offered to / list presented to : Patient      Expected Discharge Plan and Services   Discharge Planning Services: CM Consult Post Acute Care Choice: Home Health Living arrangements for the past 2 months: Single Family Home                 DME Arranged: N/A DME Agency: NA       HH Arranged: RN HH Agency: [facility] Date HH Agency Contacted: 07/24/24 Time HH Agency Contacted: 1207 Representative spoke with at [facility] Agency: Pam  Prior Living Arrangements/Services Living arrangements for the past 2 months: Single Family Home Lives with:: Spouse Patient language and need for interpreter reviewed:: Yes Do you feel safe going back to the place where you live?: Yes      Need for Family Participation in Patient Care: Yes  (Comment) Care giver support system in place?: Yes (comment) Current home services: DME Criminal Activity/Legal Involvement Pertinent to Current Situation/Hospitalization: No - Comment as needed  Activities of Daily Living   ADL Screening (condition at time of admission) Independently performs ADLs?: Yes (appropriate for developmental age) Is the patient deaf or have difficulty hearing?: No Does the patient have difficulty seeing, even when wearing glasses/contacts?: No Does the patient have difficulty concentrating, remembering, or making decisions?: No  Permission Sought/Granted   Permission granted to share information with : Yes, Verbal Permission Granted  Share Information with NAME: wife [name]  Permission granted to share info w AGENCY: [facility]        Emotional Assessment Appearance:: Appears stated age Attitude/Demeanor/Rapport: Engaged Affect (typically observed): Appropriate Orientation: : Oriented to Self, Oriented to Place, Oriented to  Time, Oriented to Situation Alcohol / Substance Use: Not Applicable Psych Involvement: No (comment)  Admission diagnosis:  Ulcer of right ankle, unspecified ulcer stage (HCC) [L97.319] Infected wound [T14.8XXA, L08.9] Patient Active Problem List   Diagnosis Date Noted   Pseudomonas infection 07/21/2024   Polymicrobial bacterial infection 07/21/2024   Infected wound 07/20/2024   Medication monitoring encounter 06/25/2024   Wound of right ankle 06/25/2024   Hypertension 06/25/2024   Ankle abrasion with infection, left, initial encounter 06/13/2024   Ankle wound, right, initial encounter 05/18/2024   PCP:  Golkowski  Euel Banker, MD Pharmacy:   [phone] [facility] [place] - [address] [place] [address] Phone: [phone] Fax: [phone]  [facility] [address] [place] [address] Phone: [phone] Fax: [phone]     Social Drivers of Health  (SDOH) Social History: SDOH Screenings   Food Insecurity: No Food Insecurity (07/20/2024)  Housing: Unknown (07/20/2024)  Transportation Needs: No Transportation Needs (07/20/2024)  Utilities: Not At Risk (07/20/2024)  Financial Resource Strain: Low Risk (06/12/2024)   Received from [facility]  Social Connections: Socially Integrated (06/13/2024)  Tobacco Use: Low Risk (07/18/2024)   SDOH Interventions: Transportation Interventions: Inpatient TOC, Intervention Not Indicated, Patient Resources (Friends/Family)   Readmission Risk Interventions    07/23/2024    2:13 PM 06/18/2024    1:26 PM  Readmission Risk Prevention Plan  Post Dischage Appt Complete Complete  Medication Screening Complete Complete  Transportation Screening Complete Complete

## 2024-07-24 NOTE — H&P (View-Only) (Signed)
 Patient ID: Brandon Flynn, male   DOB: [birth year], 58 y.o.   MRN: [id number] Patient is status post debridement right ankle ulcer.  Antibiotics have been modified and a PICC line is in place.  Plan for repeat debridement tomorrow and will obtain AFB cultures and send tissue to pathology.  Plan for discharge to home on Thursday with IV antibiotics

## 2024-07-24 NOTE — Progress Notes (Signed)
 PHARMACY CONSULT NOTE FOR:  OUTPATIENT  PARENTERAL ANTIBIOTIC THERAPY (OPAT)  Indication: Polymicrobial right ankle wound   Regimen: Cefepime  IV 2g every 8 hours  - Metronidazole  500 mg PO every 12 hours   End date: 08/22/2024  Additionally will be on linezolid  600 mg PO every 12 hours (end of therapy of 08/03/2024)  IV antibiotic discharge orders are pended. To discharging provider:  please sign these orders via discharge navigator,  Select New Orders & click on the button choice - Manage This Unsigned Work.    Thank you for allowing pharmacy to be a part of this patient's care.  Feliciano Close, PharmD PGY2 Infectious Diseases Pharmacy Resident

## 2024-07-24 NOTE — Progress Notes (Signed)
 Patient ID: Brandon Flynn, male   DOB: [birth year], 58 y.o.   MRN: [id number] Patient is status post debridement right ankle ulcer.  Antibiotics have been modified and a PICC line is in place.  Plan for repeat debridement tomorrow and will obtain AFB cultures and send tissue to pathology.  Plan for discharge to home on Thursday with IV antibiotics

## 2024-07-24 NOTE — Progress Notes (Signed)
" ° ° °  PROCEDURAL EXPEDITER PROGRESS NOTE  Patient Name: Brandon Flynn  DOB:01/09/1966 Date of Admission: 07/20/2024  Date of Assessment:07/24/24   -------------------------------------------------------------------------------------------------------------------   Brief clinical summary: patient going for excisional debridement of the right ankle wound surgery on 07-25-24.  Orders in place:   Yes,   Communication with surgical team if no orders: None  Labs, test, and orders reviewed: yes  Barriers noted: None   I------------------------------------------------------------------------------------------------------------------  [facility] Expediter, Brandon Flynn Please contact us  directly via secure chat (search for [facility]) or by calling us  at [phone] [facility]).  "

## 2024-07-24 NOTE — Progress Notes (Signed)
 "        [facility]  Date of Admission:  07/20/2024      Total days of antibiotics 3   Linezolid  1/19  Cefepime  1/19  Metronidazole             ASSESSMENT: Brandon Flynn is a 59 y.o. male admitted with:   Polymicrobial infection of chronic wound, Rt ankle - Initially started as a tick bite that developed a persistent scab that would recur despite using doxycycline and topical mupirocin. [place] and completed 10-d course of cipro  in November. Debrided first Nov 14th (Staph Epi, Diptheroids), Dec 10th (PsA, Kleb Aerogenes, Bacteroides spp) >> complicated by large hematoma with skin ischemia 2/2 HTN. 12/12 (healthy appearing wound, AFB negative). Completed 2 week course linezolid  + ciprofloxacin  + metronidazole  through 12/25.  Wound now is growing pseudomonas and staphyloccus hominis. Given history of zosyn  resistance in the past will switch to cefepime  (the MIC is 8 and viable option for now).  As much topical / surgical control as we can offer would be helpful to decrease bioburden - appreciate Dr. Harden with repeated OR evals.  Late growth of staph hominis yesterday and linezolid  added. Unclear if this is pathogenic or more of a colonizer - suspect the latter.  - plan for cefepime  x 4 weeks  - linezolid  600 mg BID x 2 weeks  - FU intra-op findings  - FU skin pathology to ensure no underlying pyoderma (can be discharged prior to final call and d/w him outpatient)  - FU AFB cultures   Chronic Non-Healing wound -  We have requested Dr. 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 Now showing FQ resistance.  Cefipime MIC dose dependent - 2 gm TID will work   Medication monitoring -  PLT 276 and tolerated well previously.   Kidney function stable - follow for changes that would warrant adjustments in dose for antibiotics   Isolation Recommendations -  Standard / Universal   Vascular Access -  -PICC ordered and in place L arm  -Home health orders to maintain PICC line care and education for patient described below   Discharge Planning / Coordination of Care -  -Outpatient antibiotics set  -Discussed with Brandon Flynn, ID pharmacy and primary   Medication Monitoring -  -Safety labs ordered and detailed below to be followed in OPAT clinic  ID will sign off - please call back with any questions/concerns or if we can be of further assistance.     PLAN: - Cefepime  + Metronidazole  4 weeks with EOT 2/18 - Linezolid  through 08/03/2024 for 2 week coverage  - FU intra-op findings  - recommend tissue to pathology for consideration of pyoderma  - D/C arrangements as outlined      OPAT ORDERS:  Diagnosis: chronic wound  Culture Result: PsA (R-FQ, hx zosyn  R), CoNS (MR)  Allergies[1]   Discharge antibiotics to be given via PICC line:  Cefepime  IV 2g every 8 hours   AND Metronidazole  500 mg PO every 12 hours    Duration: 4 weeks   End Date: 08/22/2024  [facility] Care Per Protocol with [employer] Use: Home health RN for IV administration and teaching, line care and labs.    Labs weekly while on  IV antibiotics: _x_ CBC with differential __ BMP **TWICE WEEKLY ON VANCOMYCIN   _x_ CMP _x_ CRP _x_ ESR __ Vancomycin  trough TWICE WEEKLY __ CK  __ Please pull PIC at completion of IV antibiotics _x_ Please leave PIC in place until doctor has seen patient or been notified  Fax weekly labs to [phone]  Clinic Follow Up Appt: 08/03/24 @ 9:45 am with Brandon Flynn virtually to review path and assess abx tolerance   08/21/24 @ 11:00 am with Dr. Overton for 4 week check    Principal Problem:   Infected wound Active Problems:   Pseudomonas infection   Polymicrobial bacterial infection     amLODipine   5 mg Oral Daily   vitamin C   1,000 mg Oral Daily   Chlorhexidine  Gluconate Cloth  6 each Topical Daily   docusate sodium   100 mg Oral BID   enoxaparin  (LOVENOX ) injection  40 mg Subcutaneous Q24H   gabapentin   300 mg Oral QHS   ibuprofen   400 mg Oral q AM   irbesartan   150 mg Oral Daily   linezolid   600 mg Oral Q12H   metroNIDAZOLE   500 mg Oral Q12H    SUBJECTIVE: Feeling well - eating breakfast. PICC In with out complications   Review of Systems: Review of Systems  Constitutional:  Negative for fever.    Allergies[2]  OBJECTIVE: Vitals:   07/23/24 1751 07/23/24 2034 07/23/24 2157 07/24/24 0551  BP: (!) 171/101 (!) 176/101 (!) 159/98 (!) 155/93  Pulse: 92 86  66  Resp: 17 18  16   Temp: 98.2 F (36.8 C) 98.5 F (36.9 C)  98.3 F (36.8 C)  TempSrc: Oral Oral  Oral  SpO2: 99% 95%  99%  Weight:      Height:       Body mass index is 30.84 kg/m.  Physical Exam Constitutional:      Appearance: Normal appearance. He is not ill-appearing.  HENT:     Head: Normocephalic.     Mouth/Throat:     Mouth: Mucous membranes are moist.     Pharynx: Oropharynx is clear.  Eyes:     General: No scleral icterus. Cardiovascular:     Rate and Rhythm: Normal rate and regular rhythm.  Pulmonary:     Effort: Pulmonary effort is normal.  Musculoskeletal:        General: Normal range of motion.     Cervical back: Normal range of motion.     Comments: Ankle wrapped in clean / dry dressing  Skin:    Coloration: Skin is not jaundiced or pale.  Neurological:     Mental Status: He is alert and oriented to person, place, and time.  Psychiatric:        Mood and Affect: Mood normal.        Judgment: Judgment normal.     Lab Results Lab Results  Component Value Date   WBC 10.5 07/24/2024   HGB 11.1 (L) 07/24/2024   HCT 33.1 (L) 07/24/2024   MCV 82.8 07/24/2024   PLT 279 07/24/2024    Lab Results  Component Value Date   CREATININE 1.31 (H) 07/20/2024   BUN 14  07/20/2024   NA 137 07/20/2024   K 3.5 07/20/2024   CL 103 07/20/2024   CO2 22 07/20/2024    Lab Results  Component Value Date   ALT 31 07/20/2024   AST 23 07/20/2024   ALKPHOS 53 07/20/2024   BILITOT 0.5 07/20/2024     Microbiology: Recent Results (from  the past 240 hours)  Aerobic/Anaerobic Culture w Gram Stain (surgical/deep wound)     Status: None (Preliminary result)   Collection Time: 07/20/24 10:03 AM   Specimen: Soft Tissue, Other  Result Value Ref Range Status   Specimen Description TISSUE  Final   Special Requests RT ANKLE  Final   Gram Stain   Final    NO WBC SEEN ABUNDANT GRAM NEGATIVE RODS Performed at [facility], [address], [place], [place] [address]    Culture   Final    ABUNDANT PSEUDOMONAS AERUGINOSA FEW STAPHYLOCOCCUS HAEMOLYTICUS NO ANAEROBES ISOLATED; CULTURE IN PROGRESS FOR 5 DAYS    Report Status PENDING  Incomplete   Organism ID, Bacteria PSEUDOMONAS AERUGINOSA  Final   Organism ID, Bacteria STAPHYLOCOCCUS HAEMOLYTICUS  Final      Susceptibility   Pseudomonas aeruginosa - MIC*    MEROPENEM  <=0.25 SENSITIVE Sensitive     CIPROFLOXACIN  2 RESISTANT Resistant     IMIPENEM 1 SENSITIVE Sensitive     PIP/TAZO Value in next row Sensitive      8 SENSITIVEThis is a modified FDA-approved test that has been validated and its performance characteristics determined by the reporting laboratory.  This laboratory is certified under the Clinical Laboratory Improvement Amendments CLIA as qualified to perform high complexity clinical laboratory testing.    CEFTAZIDIME/AVIBACTAM Value in next row Sensitive      8 SENSITIVEThis is a modified FDA-approved test that has been validated and its performance characteristics determined by the reporting laboratory.  This laboratory is certified under the Clinical Laboratory Improvement Amendments CLIA as qualified to perform high complexity clinical laboratory testing.    CEFTOLOZANE/TAZOBACTAM Value in next row  Sensitive      8 SENSITIVEThis is a modified FDA-approved test that has been validated and its performance characteristics determined by the reporting laboratory.  This laboratory is certified under the Clinical Laboratory Improvement Amendments CLIA as qualified to perform high complexity clinical laboratory testing.    TOBRAMYCIN  Value in next row Sensitive      8 SENSITIVEThis is a modified FDA-approved test that has been validated and its performance characteristics determined by the reporting laboratory.  This laboratory is certified under the Clinical Laboratory Improvement Amendments CLIA as qualified to perform high complexity clinical laboratory testing.    CEFTAZIDIME Value in next row Sensitive      8 SENSITIVEThis is a modified FDA-approved test that has been validated and its performance characteristics determined by the reporting laboratory.  This laboratory is certified under the Clinical Laboratory Improvement Amendments CLIA as qualified to perform high complexity clinical laboratory testing.    * ABUNDANT PSEUDOMONAS AERUGINOSA   Staphylococcus haemolyticus - MIC*    CIPROFLOXACIN  Value in next row Sensitive      8 SENSITIVEThis is a modified FDA-approved test that has been validated and its performance characteristics determined by the reporting laboratory.  This laboratory is certified under the Clinical Laboratory Improvement Amendments CLIA as qualified to perform high complexity clinical laboratory testing.    ERYTHROMYCIN Value in next row Resistant      8 SENSITIVEThis is a modified FDA-approved test that has been validated and its performance characteristics determined by the reporting laboratory.  This laboratory is certified under the Clinical Laboratory Improvement Amendments CLIA as qualified to perform high complexity clinical laboratory testing.    GENTAMICIN Value in next row Sensitive      8 SENSITIVEThis is a modified FDA-approved test that has been validated and its  performance characteristics  determined by the reporting laboratory.  This laboratory is certified under the Clinical Laboratory Improvement Amendments CLIA as qualified to perform high complexity clinical laboratory testing.    OXACILLIN Value in next row Resistant      8 SENSITIVEThis is a modified FDA-approved test that has been validated and its performance characteristics determined by the reporting laboratory.  This laboratory is certified under the Clinical Laboratory Improvement Amendments CLIA as qualified to perform high complexity clinical laboratory testing.    TETRACYCLINE Value in next row Resistant      8 SENSITIVEThis is a modified FDA-approved test that has been validated and its performance characteristics determined by the reporting laboratory.  This laboratory is certified under the Clinical Laboratory Improvement Amendments CLIA as qualified to perform high complexity clinical laboratory testing.    VANCOMYCIN  Value in next row Sensitive      8 SENSITIVEThis is a modified FDA-approved test that has been validated and its performance characteristics determined by the reporting laboratory.  This laboratory is certified under the Clinical Laboratory Improvement Amendments CLIA as qualified to perform high complexity clinical laboratory testing.    TRIMETH/SULFA Value in next row Sensitive      8 SENSITIVEThis is a modified FDA-approved test that has been validated and its performance characteristics determined by the reporting laboratory.  This laboratory is certified under the Clinical Laboratory Improvement Amendments CLIA as qualified to perform high complexity clinical laboratory testing.    CLINDAMYCIN Value in next row Resistant      8 SENSITIVEThis is a modified FDA-approved test that has been validated and its performance characteristics determined by the reporting laboratory.  This laboratory is certified under the Clinical Laboratory Improvement Amendments CLIA as qualified to  perform high complexity clinical laboratory testing.    RIFAMPIN Value in next row Sensitive      8 SENSITIVEThis is a modified FDA-approved test that has been validated and its performance characteristics determined by the reporting laboratory.  This laboratory is certified under the Clinical Laboratory Improvement Amendments CLIA as qualified to perform high complexity clinical laboratory testing.    Inducible Clindamycin Value in next row Resistant      8 SENSITIVEThis is a modified FDA-approved test that has been validated and its performance characteristics determined by the reporting laboratory.  This laboratory is certified under the Clinical Laboratory Improvement Amendments CLIA as qualified to perform high complexity clinical laboratory testing.    * FEW STAPHYLOCOCCUS HAEMOLYTICUS     Brandon Fireman, MSN, NP-C [facility]  [email or web address] Pager: [phone] Office: [phone] [facility] Main Line: [phone] *Secure Chat Communication Welcome  Total Encounter Time: 35 min     [1]  Allergies Allergen Reactions   Sulfa Antibiotics Itching  [2]  Allergies Allergen Reactions   Sulfa Antibiotics Itching   "

## 2024-07-24 NOTE — Progress Notes (Signed)
"     [facility]  HH RN ordered for IV antibiotics.  No PT follow needed.     [name] PA-C "

## 2024-07-24 NOTE — Plan of Care (Signed)
" °  Problem: Education: Goal: Knowledge of the prescribed therapeutic regimen will improve Outcome: Progressing   Problem: Bowel/Gastric: Goal: Gastrointestinal status for postoperative course will improve Outcome: Progressing   Problem: Cardiac: Goal: Ability to maintain an adequate cardiac output Outcome: Progressing Goal: Will show no evidence of cardiac arrhythmias Outcome: Progressing   Problem: Neurological: Goal: Will regain or maintain usual level of consciousness Outcome: Progressing   Problem: Clinical Measurements: Goal: Ability to maintain clinical measurements within normal limits Outcome: Progressing Goal: Postoperative complications will be avoided or minimized Outcome: Progressing   Problem: Respiratory: Goal: Will regain and/or maintain adequate ventilation Outcome: Progressing Goal: Respiratory status will improve Outcome: Progressing   Problem: Skin Integrity: Goal: Demonstrates signs of wound healing without infection Outcome: Progressing   Problem: Urinary Elimination: Goal: Will remain free from infection Outcome: Progressing Goal: Ability to achieve and maintain adequate urine output Outcome: Progressing   Problem: Education: Goal: Knowledge of General Education information will improve Description: Including pain rating scale, medication(s)/side effects and non-pharmacologic comfort measures Outcome: Progressing   Problem: Health Behavior/Discharge Planning: Goal: Ability to manage health-related needs will improve Outcome: Progressing   Problem: Clinical Measurements: Goal: Ability to maintain clinical measurements within normal limits will improve Outcome: Progressing Goal: Will remain free from infection Outcome: Progressing Goal: Diagnostic test results will improve Outcome: Progressing Goal: Respiratory complications will improve Outcome: Progressing Goal: Cardiovascular complication will be avoided Outcome: Progressing    Problem: Activity: Goal: Risk for activity intolerance will decrease Outcome: Progressing   Problem: Nutrition: Goal: Adequate nutrition will be maintained Outcome: Progressing   Problem: Coping: Goal: Level of anxiety will decrease Outcome: Progressing   Problem: Elimination: Goal: Will not experience complications related to bowel motility Outcome: Progressing Goal: Will not experience complications related to urinary retention Outcome: Progressing   Problem: Pain Managment: Goal: General experience of comfort will improve and/or be controlled Outcome: Progressing   Problem: Safety: Goal: Ability to remain free from injury will improve Outcome: Progressing   Problem: Skin Integrity: Goal: Risk for impaired skin integrity will decrease Outcome: Progressing   "

## 2024-07-24 NOTE — Plan of Care (Signed)
   Problem: Education: Goal: Knowledge of the prescribed therapeutic regimen will improve Outcome: Progressing

## 2024-07-25 ENCOUNTER — Encounter (HOSPITAL_COMMUNITY)
Admission: RE | Disposition: A | Discharge status: Home Health | Payer: Self-pay | Source: Home / Self Care | Attending: Orthopedic Surgery

## 2024-07-25 ENCOUNTER — Inpatient Hospital Stay (HOSPITAL_COMMUNITY): Admitting: Anesthesiology

## 2024-07-25 ENCOUNTER — Other Ambulatory Visit (HOSPITAL_COMMUNITY): Payer: Self-pay

## 2024-07-25 ENCOUNTER — Encounter (HOSPITAL_COMMUNITY): Payer: Self-pay | Admitting: Orthopedic Surgery

## 2024-07-25 DIAGNOSIS — S91001A Unspecified open wound, right ankle, initial encounter: Secondary | ICD-10-CM | POA: Diagnosis not present

## 2024-07-25 DIAGNOSIS — A498 Other bacterial infections of unspecified site: Secondary | ICD-10-CM | POA: Diagnosis not present

## 2024-07-25 DIAGNOSIS — A499 Bacterial infection, unspecified: Secondary | ICD-10-CM

## 2024-07-25 DIAGNOSIS — L089 Local infection of the skin and subcutaneous tissue, unspecified: Secondary | ICD-10-CM

## 2024-07-25 DIAGNOSIS — T148XXA Other injury of unspecified body region, initial encounter: Secondary | ICD-10-CM

## 2024-07-25 HISTORY — PX: INCISION AND DRAINAGE OF DEEP ABSCESS, ANKLE: SHX7360

## 2024-07-25 LAB — AEROBIC/ANAEROBIC CULTURE W GRAM STAIN (SURGICAL/DEEP WOUND): Gram Stain: NONE SEEN

## 2024-07-25 MED ORDER — CHLORHEXIDINE GLUCONATE 0.12 % MT SOLN: 15.0000 mL | Freq: Once | OROMUCOSAL | Status: AC

## 2024-07-25 MED ORDER — ACETAMINOPHEN 10 MG/ML IV SOLN
INTRAVENOUS | Status: AC
  Filled 2024-07-25: qty 100

## 2024-07-25 MED ORDER — DEXMEDETOMIDINE HCL IN NACL 80 MCG/20ML IV SOLN
INTRAVENOUS | Status: DC | PRN
  Administered 2024-07-25: 8 ug via INTRAVENOUS

## 2024-07-25 MED ORDER — FENTANYL CITRATE (PF) 100 MCG/2ML IJ SOLN
INTRAMUSCULAR | Status: AC
  Filled 2024-07-25: qty 2

## 2024-07-25 MED ORDER — OXYCODONE HCL 5 MG PO TABS
ORAL_TABLET | ORAL | Status: AC
  Filled 2024-07-25: qty 1

## 2024-07-25 MED ORDER — ORAL CARE MOUTH RINSE: 15.0000 mL | Freq: Once | OROMUCOSAL | Status: AC

## 2024-07-25 MED ORDER — DROPERIDOL 2.5 MG/ML IJ SOLN: 0.6250 mg | Freq: Once | INTRAMUSCULAR | Status: DC | PRN

## 2024-07-25 MED ORDER — LACTATED RINGERS IV SOLN: INTRAVENOUS | Status: DC | PRN

## 2024-07-25 MED ORDER — ONDANSETRON HCL 4 MG/2ML IJ SOLN
INTRAMUSCULAR | Status: DC | PRN
  Administered 2024-07-25: 4 mg via INTRAVENOUS

## 2024-07-25 MED ORDER — CHLORHEXIDINE GLUCONATE 0.12 % MT SOLN
OROMUCOSAL | Status: AC
  Administered 2024-07-25: 15 mL via OROMUCOSAL
  Filled 2024-07-25: qty 15

## 2024-07-25 MED ORDER — OXYCODONE HCL 5 MG/5ML PO SOLN: 5.0000 mg | Freq: Once | ORAL | Status: AC | PRN

## 2024-07-25 MED ORDER — FENTANYL CITRATE (PF) 100 MCG/2ML IJ SOLN
25.0000 ug | INTRAMUSCULAR | Status: DC | PRN
  Administered 2024-07-25 (×4): 50 ug via INTRAVENOUS

## 2024-07-25 MED ORDER — ACETAMINOPHEN 10 MG/ML IV SOLN
1000.0000 mg | Freq: Once | INTRAVENOUS | Status: DC | PRN
  Administered 2024-07-25: 1000 mg via INTRAVENOUS

## 2024-07-25 MED ORDER — VASHE WOUND IRRIGATION OPTIME: TOPICAL | Status: DC | PRN

## 2024-07-25 MED ORDER — MIDAZOLAM HCL 2 MG/2ML IJ SOLN
INTRAMUSCULAR | Status: AC
  Filled 2024-07-25: qty 2

## 2024-07-25 MED ORDER — HYDROMORPHONE HCL 2 MG PO TABS
1.0000 mg | ORAL_TABLET | ORAL | Status: DC | PRN
  Administered 2024-07-25: 1 mg via ORAL
  Filled 2024-07-25: qty 1

## 2024-07-25 MED ORDER — FENTANYL CITRATE (PF) 100 MCG/2ML IJ SOLN
INTRAMUSCULAR | Status: DC | PRN
  Administered 2024-07-25 (×2): 50 ug via INTRAVENOUS

## 2024-07-25 MED ORDER — VANCOMYCIN HCL 1000 MG IV SOLR
INTRAVENOUS | Status: AC
  Filled 2024-07-25: qty 20

## 2024-07-25 MED ORDER — LACTATED RINGERS IV SOLN: INTRAVENOUS | Status: DC

## 2024-07-25 MED ORDER — OXYCODONE HCL ER 10 MG PO T12A
10.0000 mg | EXTENDED_RELEASE_TABLET | Freq: Two times a day (BID) | ORAL | Status: DC
  Administered 2024-07-25 – 2024-07-26 (×2): 10 mg via ORAL
  Filled 2024-07-25 (×2): qty 1

## 2024-07-25 MED ORDER — TOBRAMYCIN SULFATE 1.2 G IJ SOLR
INTRAMUSCULAR | Status: AC
  Filled 2024-07-25: qty 1.2

## 2024-07-25 MED ORDER — MIDAZOLAM HCL (PF) 2 MG/2ML IJ SOLN
INTRAMUSCULAR | Status: DC | PRN
  Administered 2024-07-25: 2 mg via INTRAVENOUS

## 2024-07-25 MED ORDER — TOBRAMYCIN SULFATE 1.2 G IJ SOLR
INTRAMUSCULAR | Status: DC | PRN
  Administered 2024-07-25: 1.2 g via TOPICAL

## 2024-07-25 MED ORDER — OXYCODONE HCL 5 MG PO TABS
5.0000 mg | ORAL_TABLET | ORAL | Status: DC | PRN
  Administered 2024-07-25: 10 mg via ORAL
  Filled 2024-07-25: qty 2

## 2024-07-25 MED ORDER — OXYCODONE HCL 5 MG PO TABS
5.0000 mg | ORAL_TABLET | Freq: Once | ORAL | Status: AC | PRN
  Administered 2024-07-25: 5 mg via ORAL

## 2024-07-25 MED ORDER — VASHE WOUND IRRIGATION OPTIME
TOPICAL | Status: DC | PRN
  Administered 2024-07-25: 34 [oz_av]

## 2024-07-25 MED ORDER — PROPOFOL 500 MG/50ML IV EMUL
INTRAVENOUS | Status: DC | PRN
  Administered 2024-07-25: 20 mg via INTRAVENOUS
  Administered 2024-07-25: 125 ug/kg/min via INTRAVENOUS

## 2024-07-25 NOTE — Anesthesia Postprocedure Evaluation (Signed)
"   Anesthesia Post Note  Patient: Brandon Flynn  Procedure(s) Performed: EXCISIONAL DEBRIDEMENT OF THE RIGHT ANKLE WOUND (Right: Ankle)     Patient location during evaluation: PACU Anesthesia Type: MAC Level of consciousness: awake and alert Pain management: pain level controlled Vital Signs Assessment: post-procedure vital signs reviewed and stable Respiratory status: spontaneous breathing, nonlabored ventilation, respiratory function stable and patient connected to nasal cannula oxygen Cardiovascular status: blood pressure returned to baseline and stable Postop Assessment: no apparent nausea or vomiting Anesthetic complications: no   No notable events documented.  Last Vitals:  Vitals:   07/25/24 1500 07/25/24 1506  BP: (!) 182/108 (!) 182/108  Pulse: 78   Resp: 14   Temp: (!) 36.3 C   SpO2: 96%     Last Pain:  Vitals:   07/25/24 1506  TempSrc:   PainSc: 9                  Rome Ade      "

## 2024-07-25 NOTE — Anesthesia Preprocedure Evaluation (Addendum)
"                                    Anesthesia Evaluation  Patient identified by MRN, date of birth, ID band Patient awake    Reviewed: Allergy & Precautions, NPO status , Patient's Chart, lab work & pertinent test results  History of Anesthesia Complications Negative for: history of anesthetic complications  Airway Mallampati: II  TM Distance: >3 FB Neck ROM: Full    Dental no notable dental hx. (+) Teeth Intact   Pulmonary neg pulmonary ROS, neg sleep apnea, neg COPD, Patient abstained from smoking.Not current smoker   Pulmonary exam normal breath sounds clear to auscultation       Cardiovascular Exercise Tolerance: Good METShypertension, Pt. on medications (-) CAD and (-) Past MI (-) dysrhythmias  Rhythm:Regular Rate:Normal - Systolic murmurs    Neuro/Psych negative neurological ROS  negative psych ROS   GI/Hepatic ,neg GERD  ,,(+)     (-) substance abuse    Endo/Other  neg diabetes    Renal/GU negative Renal ROS     Musculoskeletal   Abdominal   Peds  Hematology  (+) REFUSES BLOOD PRODUCTSPatient refuses blood product transfusion even if it meant his death. Confirmed this with patient.   Anesthesia Other Findings Past Medical History: No date: Hypertension  Reproductive/Obstetrics                              Anesthesia Physical Anesthesia Plan  ASA: 2  Anesthesia Plan: MAC   Post-op Pain Management: Ofirmev  IV (intra-op)*   Induction: Intravenous  PONV Risk Score and Plan: 1 and Propofol  infusion, TIVA, Ondansetron , Midazolam  and Dexamethasone   Airway Management Planned: Nasal Cannula and Natural Airway  Additional Equipment: None  Intra-op Plan:   Post-operative Plan:   Informed Consent: I have reviewed the patients History and Physical, chart, labs and discussed the procedure including the risks, benefits and alternatives for the proposed anesthesia with the patient or authorized representative who  has indicated his/her understanding and acceptance.   Patient has DNR.  Discussed DNR with patient and Continue DNR.   Dental advisory given  Plan Discussed with: CRNA and Surgeon  Anesthesia Plan Comments: (Patient last had vanilla ensure meal replacement drink at 0445. Will wait until 1245 for proper NPO time.  Discussed risks of anesthesia with patient, including possibility of difficulty with spontaneous ventilation under anesthesia necessitating airway intervention, PONV, and rare risks such as cardiac or respiratory or neurological events, and allergic reactions. Discussed the role of CRNA in patient's perioperative care. Patient understands.  Although patient's chart says he is full code, patient brought up to me unprompted that he is a DNR. Discussed in detail patient's DNR status. After explaining the nuances and differences of cardiac arrest in the perioperative setting vs anywhere else, the patient decided to continue the DNR in the perioperative period. )         Anesthesia Quick Evaluation  "

## 2024-07-25 NOTE — Progress Notes (Signed)
 Pt returned from surgery with wound vac alarming. Pt stated it was not properly functioning in PACU. Dr Harden notified. VM left with central supply for new wound vac. Night nurse notified that Central supplie had not returned call or sent vac.   BP 182/108, PACU nurse stated unable to get orders from Anesthesia.See  provider notification note.

## 2024-07-25 NOTE — Interval H&P Note (Signed)
 History and Physical Interval Note:  07/25/2024 6:45 AM  [name]  has presented today for surgery, with the diagnosis of Ulcer Right Ankle.  The various methods of treatment have been discussed with the patient and family. After consideration of risks, benefits and other options for treatment, the patient has consented to  Procedures with comments: INCISION AND DRAINAGE OF DEEP ABSCESS, ANKLE (Right) - RIGHT ANKLE DEBRIDEMENT as a surgical intervention.  The patient's history has been reviewed, patient examined, no change in status, stable for surgery.  I have reviewed the patient's chart and labs.  Questions were answered to the patient's satisfaction.     Derricka Mertz V Catricia Scheerer

## 2024-07-25 NOTE — Progress Notes (Signed)
 Upon completing the pre-procedure checklist, the pt stated that was given a vanilla ensure drink for surgery at 0445 today. Dr. Boone with anesthesia made aware and stated that the pt has to wait until 1300 to have the surgical procedure. Dr. Harden made aware. Sonya, RN [phone] made aware, and she stated that she had no idea the pt drank the ensure prior to her shift. Pt transported back to the floor, and is to remain N.P.O except sips with medications.

## 2024-07-25 NOTE — Op Note (Signed)
 07/25/2024  1:31 PM  PATIENT:  Brandon Flynn    PRE-OPERATIVE DIAGNOSIS:  Ulcer Right Ankle  POST-OPERATIVE DIAGNOSIS:  Same  PROCEDURE:  EXCISIONAL DEBRIDEMENT OF THE RIGHT ANKLE WOUND with excision skin soft tissue muscle fascia and tendon. Application of Kerecis micro graft 38 cm and Kerecis sheet 3 x 7 cm. Application tobramycin  powder 1.2 g. Tissue sent for AFB. Tissue sent to pathology.  SURGEON:  Jerona LULLA Sage, MD  PHYSICIAN ASSISTANT:None ANESTHESIA:   General  PREOPERATIVE INDICATIONS:  Brandon Flynn is a  59 y.o. male with a diagnosis of Ulcer Right Ankle who failed conservative measures and elected for surgical management.    The risks benefits and alternatives were discussed with the patient preoperatively including but not limited to the risks of infection, bleeding, nerve injury, cardiopulmonary complications, the need for revision surgery, among others, and the patient was willing to proceed.  OPERATIVE IMPLANTS:   Implant Name Type Inv. Item Serial No. Manufacturer Lot No. LRB No. Used Action  GRAFT SKIN WND MICRO 38 - [id number] Tissue GRAFT SKIN WND MICRO 38  [employer] [id number] Right 1 Implanted  GRAFT SKIN WND SURGIBIND 3X7 - [id number] Tissue GRAFT SKIN WND SURGIBIND 3X7  [employer] [id number] Right 1 Implanted    @ENCIMAGES @  OPERATIVE FINDINGS: Tissue margins had good healthy tissue.  Wound bed had 100% granulation tissue.  OPERATIVE PROCEDURE: Patient brought the operating room and underwent a regional anesthetic.  After adequate levels anesthesia were obtained patient's right lower extremity was prepped using DuraPrep draped into a sterile field a timeout was called.  A 21 blade knife was used to excise skin soft tissue muscle fascia and tendon back to healthy viable bleeding tissue.  Tissue was sent for both AFB and pathology evaluation for possible pyoderma gangrenosum.  After debridement the wound was irrigated with Vashe.  The  wound bed was filled with 38 cm of Kerecis micro graft that was mixed with 1.2 g of tobramycin  powder this was then covered by a 3 x 7 Kerecis sheet secured with a cleanse choice wound VAC sponge and a peel in place sponge.  This had a good suction fit was hooked to a Prevena pump patient was taken the PACU in stable condition   DISCHARGE PLANNING:  Antibiotic duration: Continue antibiotics based on infectious disease recommendations  Weightbearing: Weightbearing as tolerated  Pain medication: Opioid pathway  Dressing care/ Wound VAC: Continue wound VAC for 1 week  Ambulatory devices: As needed  Discharge to: Anticipate discharge to home  Follow-up: In the office 1 week post operative.

## 2024-07-25 NOTE — Plan of Care (Signed)
   Problem: Education: Goal: Knowledge of the prescribed therapeutic regimen will improve Outcome: Progressing

## 2024-07-25 NOTE — Plan of Care (Signed)
" °  Problem: Education: Goal: Knowledge of the prescribed therapeutic regimen will improve Outcome: Progressing   Problem: Bowel/Gastric: Goal: Gastrointestinal status for postoperative course will improve Outcome: Progressing   Problem: Cardiac: Goal: Ability to maintain an adequate cardiac output Outcome: Progressing Goal: Will show no evidence of cardiac arrhythmias Outcome: Progressing   Problem: Neurological: Goal: Will regain or maintain usual level of consciousness Outcome: Progressing   Problem: Clinical Measurements: Goal: Ability to maintain clinical measurements within normal limits Outcome: Progressing Goal: Postoperative complications will be avoided or minimized Outcome: Progressing   Problem: Respiratory: Goal: Will regain and/or maintain adequate ventilation Outcome: Progressing Goal: Respiratory status will improve Outcome: Progressing   Problem: Skin Integrity: Goal: Demonstrates signs of wound healing without infection Outcome: Progressing   Problem: Urinary Elimination: Goal: Will remain free from infection Outcome: Progressing Goal: Ability to achieve and maintain adequate urine output Outcome: Progressing   Problem: Education: Goal: Knowledge of General Education information will improve Description: Including pain rating scale, medication(s)/side effects and non-pharmacologic comfort measures Outcome: Progressing   Problem: Health Behavior/Discharge Planning: Goal: Ability to manage health-related needs will improve Outcome: Progressing   Problem: Clinical Measurements: Goal: Ability to maintain clinical measurements within normal limits will improve Outcome: Progressing Goal: Will remain free from infection Outcome: Progressing Goal: Diagnostic test results will improve Outcome: Progressing Goal: Respiratory complications will improve Outcome: Progressing Goal: Cardiovascular complication will be avoided Outcome: Progressing    Problem: Activity: Goal: Risk for activity intolerance will decrease Outcome: Progressing   Problem: Nutrition: Goal: Adequate nutrition will be maintained Outcome: Progressing   Problem: Coping: Goal: Level of anxiety will decrease Outcome: Progressing   Problem: Elimination: Goal: Will not experience complications related to bowel motility Outcome: Progressing Goal: Will not experience complications related to urinary retention Outcome: Progressing   Problem: Pain Managment: Goal: General experience of comfort will improve and/or be controlled Outcome: Progressing   Problem: Safety: Goal: Ability to remain free from injury will improve Outcome: Progressing   Problem: Skin Integrity: Goal: Risk for impaired skin integrity will decrease Outcome: Progressing   "

## 2024-07-25 NOTE — Transfer of Care (Signed)
 Immediate Anesthesia Transfer of Care Note  Patient: Brandon Flynn  Procedure(s) Performed: EXCISIONAL DEBRIDEMENT OF THE RIGHT ANKLE WOUND (Right: Ankle)  Patient Location: PACU  Anesthesia Type:MAC  Level of Consciousness: awake, drowsy, and patient cooperative  Airway & Oxygen Therapy: Patient Spontanous Breathing and Patient connected to nasal cannula oxygen  Post-op Assessment: Report given to RN and Post -op Vital signs reviewed and stable  Post vital signs: Reviewed and stable  Last Vitals:  Vitals Value Taken Time  BP 155/101 07/25/24 13:30  Temp    Pulse 90 07/25/24 13:33  Resp 16 07/25/24 13:32  SpO2 97 % 07/25/24 13:33  Vitals shown include unfiled device data.  Last Pain:  Vitals:   07/25/24 1119  TempSrc: Oral  PainSc:       Patients Stated Pain Goal: 2 (07/25/24 0015)  Complications: No notable events documented.

## 2024-07-26 ENCOUNTER — Other Ambulatory Visit (HOSPITAL_COMMUNITY): Payer: Self-pay

## 2024-07-26 ENCOUNTER — Telehealth: Payer: Self-pay | Admitting: Orthopedic Surgery

## 2024-07-26 ENCOUNTER — Encounter (HOSPITAL_COMMUNITY): Payer: Self-pay | Admitting: Orthopedic Surgery

## 2024-07-26 MED ORDER — CEFEPIME IV (FOR PTA / DISCHARGE USE ONLY): 2.0000 g | Freq: Three times a day (TID) | INTRAVENOUS | 0 refills | Status: AC

## 2024-07-26 MED ORDER — HEPARIN SOD (PORK) LOCK FLUSH 100 UNIT/ML IV SOLN
250.0000 [IU] | INTRAVENOUS | Status: AC | PRN
  Administered 2024-07-26: 250 [IU]

## 2024-07-26 MED ORDER — OXYCODONE HCL 5 MG PO TABS
5.0000 mg | ORAL_TABLET | ORAL | 0 refills | Status: AC | PRN
  Filled 2024-07-26: qty 30, 5d supply, fill #0

## 2024-07-26 NOTE — Telephone Encounter (Signed)
 Pt wife submitted medical release form, FMLA, and 20.00 payment

## 2024-07-26 NOTE — TOC Progression Note (Signed)
 Transition of Care (TOC) - Progression Note   Spoke to Pam with [facility]. Pam has received the signed OPAT. Patient can discharge to home this morning and [facility] will deliver antibiotic to his home in time for 2 pm dose. Patient, wife and nurse aware  Patient Details  Name: Brandon Flynn MRN: [id number] Date of Birth: 1965-12-10  Transition of Care [facility]) CM/SW Contact  Shiesha Jahn, Powell Jansky, RN Phone Number: 07/26/2024, 9:51 AM  Clinical Narrative:       Expected Discharge Plan: Home w Home Health Services Barriers to Discharge: Continued Medical Work up               Expected Discharge Plan and Services   Discharge Planning Services: CM Consult Post Acute Care Choice: Home Health Living arrangements for the past 2 months: Single Family Home Expected Discharge Date: 07/26/24               DME Arranged: N/A DME Agency: NA       HH Arranged: RN HH Agency: [facility] Date HH Agency Contacted: 07/24/24 Time HH Agency Contacted: 1207 Representative spoke with at [facility] Agency: Pam   Social Drivers of Health (SDOH) Interventions SDOH Screenings   Food Insecurity: No Food Insecurity (07/20/2024)  Housing: Unknown (07/20/2024)  Transportation Needs: No Transportation Needs (07/20/2024)  Utilities: Not At Risk (07/20/2024)  Financial Resource Strain: Low Risk (06/12/2024)   Received from [facility]  Social Connections: Socially Integrated (06/13/2024)  Tobacco Use: Low Risk (07/25/2024)    Readmission Risk Interventions    07/23/2024    2:13 PM 06/18/2024    1:26 PM  Readmission Risk Prevention Plan  Post Dischage Appt Complete Complete  Medication Screening Complete Complete  Transportation Screening Complete Complete

## 2024-07-26 NOTE — Discharge Summary (Signed)
 Physician Discharge Summary  Patient ID: Brandon Flynn MRN: [id number] DOB/AGE: [birth year] 59 y.o.  Admit date: 07/20/2024 Discharge date: 07/26/2024  Admission Diagnoses:  Principal Problem:   Infected wound Active Problems:   Pseudomonas infection   Polymicrobial bacterial infection   Discharge Diagnoses:  Same  Past Medical History:  Diagnosis Date   Hypertension     Surgeries: Procedures: EXCISIONAL DEBRIDEMENT OF THE RIGHT ANKLE WOUND on 07/25/2024   Consultants:   Discharged Condition: Improved  Hospital Course: Brandon Flynn is an 59 y.o. male who was admitted 07/20/2024 with a chief complaint of No chief complaint on file. , and found to have a diagnosis of Infected wound.  They were brought to the operating room on 07/25/2024 and underwent the above named procedures.    They were given perioperative antibiotics:  Anti-infectives (From admission, onward)    Start     Dose/Rate Route Frequency Ordered Stop   07/26/24 0000  ceFEPime  (MAXIPIME ) IVPB        2 g Intravenous Every 8 hours 07/26/24 0829 08/22/24 2359   07/25/24 1319  tobramycin  (NEBCIN ) powder  Status:  Discontinued          As needed 07/25/24 1319 07/25/24 1328   07/24/24 0000  linezolid  (ZYVOX ) 600 MG tablet        600 mg Oral Every 12 hours 07/24/24 1249 08/03/24 2359   07/24/24 0000  metroNIDAZOLE  (FLAGYL ) 500 MG tablet        500 mg Oral Every 12 hours 07/24/24 1249 08/22/24 2359   07/23/24 2100  ceFEPIme  (MAXIPIME ) 2 g in sodium chloride  0.9 % 100 mL IVPB        2 g 200 mL/hr over 30 Minutes Intravenous Every 8 hours 07/23/24 1518     07/23/24 2100  metroNIDAZOLE  (FLAGYL ) tablet 500 mg        500 mg Oral Every 12 hours 07/23/24 1518     07/23/24 1630  linezolid  (ZYVOX ) tablet 600 mg        600 mg Oral Every 12 hours 07/23/24 1518     07/23/24 1400  piperacillin -tazobactam (ZOSYN ) IVPB 3.375 g  Status:  Discontinued        3.375 g 12.5 mL/hr over 240 Minutes Intravenous Every 8  hours 07/23/24 0646 07/23/24 1518   07/22/24 1400  meropenem  (MERREM ) 1 g in sodium chloride  0.9 % 100 mL IVPB  Status:  Discontinued        1 g 200 mL/hr over 30 Minutes Intravenous Every 8 hours 07/22/24 1121 07/23/24 0637   07/21/24 1800  piperacillin -tazobactam (ZOSYN ) IVPB 3.375 g  Status:  Discontinued        3.375 g 12.5 mL/hr over 240 Minutes Intravenous Every 8 hours 07/21/24 1458 07/22/24 1121   07/21/24 0915  meropenem  (MERREM ) 1 g in sodium chloride  0.9 % 100 mL IVPB  Status:  Discontinued        1 g 200 mL/hr over 30 Minutes Intravenous Every 8 hours 07/21/24 0828 07/21/24 1456   07/20/24 1008  vancomycin  (VANCOCIN ) powder  Status:  Discontinued          As needed 07/20/24 1032 07/20/24 1032   07/20/24 0715  ceFAZolin  (ANCEF ) IVPB 2g/100 mL premix        2 g 200 mL/hr over 30 Minutes Intravenous On call to O.R. 07/20/24 0701 07/20/24 1003     .  They were given compression stockings, early ambulation, and chemoprophylaxis for DVT prophylaxis.  They benefited maximally  from their hospital stay and there were no complications.    Recent vital signs:  Vitals:   07/26/24 0741 07/26/24 0743  BP: (!) 155/93 (!) 155/93  Pulse: 66 66  Resp: 18 18  Temp: 98.5 F (36.9 C) 98.5 F (36.9 C)  SpO2: 97% 97%    Recent laboratory studies:  Results for orders placed or performed during the hospital encounter of 07/20/24  CBC WITH DIFFERENTIAL   Collection Time: 07/20/24  7:02 AM  Result Value Ref Range   WBC 13.2 (H) 4.0 - 10.5 K/uL   RBC 4.61 4.22 - 5.81 MIL/uL   Hemoglobin 12.6 (L) 13.0 - 17.0 g/dL   HCT 62.5 (L) 60.9 - 47.9 %   MCV 81.1 80.0 - 100.0 fL   MCH 27.3 26.0 - 34.0 pg   MCHC 33.7 30.0 - 36.0 g/dL   RDW 85.3 88.4 - 84.4 %   Platelets 310 150 - 400 K/uL   nRBC 0.0 0.0 - 0.2 %   Neutrophils Relative % 68 %   Neutro Abs 8.8 (H) 1.7 - 7.7 K/uL   Lymphocytes Relative 25 %   Lymphs Abs 3.3 0.7 - 4.0 K/uL   Monocytes Relative 7 %   Monocytes Absolute 1.0 0.1 -  1.0 K/uL   Eosinophils Relative 0 %   Eosinophils Absolute 0.1 0.0 - 0.5 K/uL   Basophils Relative 0 %   Basophils Absolute 0.0 0.0 - 0.1 K/uL   Immature Granulocytes 0 %   Abs Immature Granulocytes 0.04 0.00 - 0.07 K/uL  Comprehensive metabolic panel   Collection Time: 07/20/24  7:02 AM  Result Value Ref Range   Sodium 137 135 - 145 mmol/L   Potassium 3.5 3.5 - 5.1 mmol/L   Chloride 103 98 - 111 mmol/L   CO2 22 22 - 32 mmol/L   Glucose, Bld 108 (H) 70 - 99 mg/dL   BUN 14 6 - 20 mg/dL   Creatinine, Ser 8.68 (H) 0.61 - 1.24 mg/dL   Calcium 9.5 8.9 - 89.6 mg/dL   Total Protein 7.4 6.5 - 8.1 g/dL   Albumin 4.2 3.5 - 5.0 g/dL   AST 23 15 - 41 U/L   ALT 31 0 - 44 U/L   Alkaline Phosphatase 53 38 - 126 U/L   Total Bilirubin 0.5 0.0 - 1.2 mg/dL   GFR, Estimated >39 >39 mL/min   Anion gap 12 5 - 15  No blood products   Collection Time: 07/20/24  7:52 AM  Result Value Ref Range   Transfuse no blood products      TRANSFUSE NO BLOOD PRODUCTS, VERIFIED BY DIAMOND EINSTEIN, RN Performed at [facility], [address], [place], [place] [address]   Aerobic/Anaerobic Culture w Gram Stain (surgical/deep wound)   Collection Time: 07/20/24 10:03 AM   Specimen: Soft Tissue, Other  Result Value Ref Range   Specimen Description TISSUE    Special Requests RT ANKLE    Gram Stain NO WBC SEEN ABUNDANT GRAM NEGATIVE RODS     Culture      ABUNDANT PSEUDOMONAS AERUGINOSA FEW STAPHYLOCOCCUS HAEMOLYTICUS NO ANAEROBES ISOLATED Performed at [facility], [address] [place], [place] [address]    Report Status 07/25/2024 FINAL    Organism ID, Bacteria PSEUDOMONAS AERUGINOSA    Organism ID, Bacteria STAPHYLOCOCCUS HAEMOLYTICUS       Susceptibility   Pseudomonas aeruginosa - MIC*    MEROPENEM  <=0.25 SENSITIVE Sensitive     CIPROFLOXACIN  2 RESISTANT Resistant  IMIPENEM 1 SENSITIVE Sensitive     PIP/TAZO Value in next row Sensitive      8 SENSITIVEThis is a modified FDA-approved  test that has been validated and its performance characteristics determined by the reporting laboratory.  This laboratory is certified under the Clinical Laboratory Improvement Amendments CLIA as qualified to perform high complexity clinical laboratory testing.    CEFTAZIDIME/AVIBACTAM Value in next row Sensitive      8 SENSITIVEThis is a modified FDA-approved test that has been validated and its performance characteristics determined by the reporting laboratory.  This laboratory is certified under the Clinical Laboratory Improvement Amendments CLIA as qualified to perform high complexity clinical laboratory testing.    CEFTOLOZANE/TAZOBACTAM Value in next row Sensitive      8 SENSITIVEThis is a modified FDA-approved test that has been validated and its performance characteristics determined by the reporting laboratory.  This laboratory is certified under the Clinical Laboratory Improvement Amendments CLIA as qualified to perform high complexity clinical laboratory testing.    TOBRAMYCIN  Value in next row Sensitive      8 SENSITIVEThis is a modified FDA-approved test that has been validated and its performance characteristics determined by the reporting laboratory.  This laboratory is certified under the Clinical Laboratory Improvement Amendments CLIA as qualified to perform high complexity clinical laboratory testing.    CEFTAZIDIME Value in next row Sensitive      8 SENSITIVEThis is a modified FDA-approved test that has been validated and its performance characteristics determined by the reporting laboratory.  This laboratory is certified under the Clinical Laboratory Improvement Amendments CLIA as qualified to perform high complexity clinical laboratory testing.    * ABUNDANT PSEUDOMONAS AERUGINOSA   Staphylococcus haemolyticus - MIC*    CIPROFLOXACIN  Value in next row Sensitive      8 SENSITIVEThis is a modified FDA-approved test that has been validated and its performance characteristics determined  by the reporting laboratory.  This laboratory is certified under the Clinical Laboratory Improvement Amendments CLIA as qualified to perform high complexity clinical laboratory testing.    ERYTHROMYCIN Value in next row Resistant      8 SENSITIVEThis is a modified FDA-approved test that has been validated and its performance characteristics determined by the reporting laboratory.  This laboratory is certified under the Clinical Laboratory Improvement Amendments CLIA as qualified to perform high complexity clinical laboratory testing.    GENTAMICIN Value in next row Sensitive      8 SENSITIVEThis is a modified FDA-approved test that has been validated and its performance characteristics determined by the reporting laboratory.  This laboratory is certified under the Clinical Laboratory Improvement Amendments CLIA as qualified to perform high complexity clinical laboratory testing.    OXACILLIN Value in next row Resistant      8 SENSITIVEThis is a modified FDA-approved test that has been validated and its performance characteristics determined by the reporting laboratory.  This laboratory is certified under the Clinical Laboratory Improvement Amendments CLIA as qualified to perform high complexity clinical laboratory testing.    TETRACYCLINE Value in next row Resistant      8 SENSITIVEThis is a modified FDA-approved test that has been validated and its performance characteristics determined by the reporting laboratory.  This laboratory is certified under the Clinical Laboratory Improvement Amendments CLIA as qualified to perform high complexity clinical laboratory testing.    VANCOMYCIN  Value in next row Sensitive      8 SENSITIVEThis is a modified FDA-approved test that has been validated and its performance characteristics determined  by the reporting laboratory.  This laboratory is certified under the Clinical Laboratory Improvement Amendments CLIA as qualified to perform high complexity clinical laboratory  testing.    TRIMETH/SULFA Value in next row Sensitive      8 SENSITIVEThis is a modified FDA-approved test that has been validated and its performance characteristics determined by the reporting laboratory.  This laboratory is certified under the Clinical Laboratory Improvement Amendments CLIA as qualified to perform high complexity clinical laboratory testing.    CLINDAMYCIN Value in next row Resistant      8 SENSITIVEThis is a modified FDA-approved test that has been validated and its performance characteristics determined by the reporting laboratory.  This laboratory is certified under the Clinical Laboratory Improvement Amendments CLIA as qualified to perform high complexity clinical laboratory testing.    RIFAMPIN Value in next row Sensitive      8 SENSITIVEThis is a modified FDA-approved test that has been validated and its performance characteristics determined by the reporting laboratory.  This laboratory is certified under the Clinical Laboratory Improvement Amendments CLIA as qualified to perform high complexity clinical laboratory testing.    Inducible Clindamycin Value in next row Resistant      8 SENSITIVEThis is a modified FDA-approved test that has been validated and its performance characteristics determined by the reporting laboratory.  This laboratory is certified under the Clinical Laboratory Improvement Amendments CLIA as qualified to perform high complexity clinical laboratory testing.    * FEW STAPHYLOCOCCUS HAEMOLYTICUS  CBC with Differential/Platelet   Collection Time: 07/24/24  4:35 AM  Result Value Ref Range   WBC 10.5 4.0 - 10.5 K/uL   RBC 4.00 (L) 4.22 - 5.81 MIL/uL   Hemoglobin 11.1 (L) 13.0 - 17.0 g/dL   HCT 66.8 (L) 60.9 - 47.9 %   MCV 82.8 80.0 - 100.0 fL   MCH 27.8 26.0 - 34.0 pg   MCHC 33.5 30.0 - 36.0 g/dL   RDW 85.5 88.4 - 84.4 %   Platelets 279 150 - 400 K/uL   nRBC 0.0 0.0 - 0.2 %   Neutrophils Relative % 59 %   Neutro Abs 6.1 1.7 - 7.7 K/uL    Lymphocytes Relative 32 %   Lymphs Abs 3.4 0.7 - 4.0 K/uL   Monocytes Relative 8 %   Monocytes Absolute 0.8 0.1 - 1.0 K/uL   Eosinophils Relative 1 %   Eosinophils Absolute 0.1 0.0 - 0.5 K/uL   Basophils Relative 0 %   Basophils Absolute 0.0 0.0 - 0.1 K/uL   Immature Granulocytes 0 %   Abs Immature Granulocytes 0.04 0.00 - 0.07 K/uL  Surgical pcr screen   Collection Time: 07/24/24  4:07 PM   Specimen: Nasal Mucosa; Nasal Swab  Result Value Ref Range   MRSA, PCR NEGATIVE NEGATIVE   Staphylococcus aureus NEGATIVE NEGATIVE    Discharge Medications:   Scheduled Meds:  amLODipine   5 mg Oral Daily   vitamin C   1,000 mg Oral Daily   Chlorhexidine  Gluconate Cloth  6 each Topical Daily   docusate sodium   100 mg Oral BID   enoxaparin  (LOVENOX ) injection  40 mg Subcutaneous Q24H   gabapentin   300 mg Oral QHS   ibuprofen   400 mg Oral q AM   irbesartan   150 mg Oral Daily   linezolid   600 mg Oral Q12H   metroNIDAZOLE   500 mg Oral Q12H   oxyCODONE   10 mg Oral Q12H   Continuous Infusions:  ceFEPime  (MAXIPIME ) IV 2 g (07/26/24 0618)  PRN Meds:.acetaminophen , HYDROmorphone , metoCLOPramide  **OR** metoCLOPramide  (REGLAN ) injection, morphine  injection, ondansetron  **OR** ondansetron  (ZOFRAN ) IV, oxyCODONE , sodium chloride  flush    Diagnostic Studies: US  EKG SITE RITE Result Date: 07/23/2024 If Site Rite image not attached, placement could not be confirmed due to current cardiac rhythm.   Disposition: Discharge disposition: 01-Home or Self Care       Discharge Instructions     [facility] pharmacist to adjust dose for Vancomycin , Aminoglycosides and other anti-infective therapies as requested by physician.   Complete by: As directed    [facility] to provide Cath Flo 2mg    Complete by: As directed    Administer for PICC line occlusion and as ordered by physician for other access device issues.   Anaphylaxis Kit: Provided to treat any anaphylactic reaction to  the medication being provided to the patient if First Dose or when requested by physician   Complete by: As directed    Epinephrine 1mg /ml vial / amp: Administer 0.3mg  (0.6ml) subcutaneously once for moderate to severe anaphylaxis, nurse to call physician and pharmacy when reaction occurs and call 911 if needed for immediate care   Diphenhydramine 50mg /ml IV vial: Administer 25-50mg  IV/IM PRN for first dose reaction, rash, itching, mild reaction, nurse to call physician and pharmacy when reaction occurs   Sodium Chloride  0.9% NS 500ml IV: Administer if needed for hypovolemic blood pressure drop or as ordered by physician after call to physician with anaphylactic reaction   Call MD / Call 911   Complete by: As directed    If you experience chest pain or shortness of breath, CALL 911 and be transported to the hospital emergency room.  If you develope a fever above 101 F, pus (white drainage) or increased drainage or redness at the wound, or calf pain, call your surgeon's office.   Change dressing on IV access line weekly and PRN   Complete by: As directed    Constipation Prevention   Complete by: As directed    Drink plenty of fluids.  Prune juice may be helpful.  You may use a stool softener, such as Colace (over the counter) 100 mg twice a day.  Use MiraLax (over the counter) for constipation as needed.   Discharge instructions   Complete by: As directed    Pre vena wound vac until f/u elevate your leg to decrease swelling and help with pain laying down. Walk as tolerates and alternate with elevation.   Flush IV access with Sodium Chloride  0.9% and Heparin  10 units/ml or 100 units/ml   Complete by: As directed    Home infusion instructions - [facility]   Complete by: As directed    Instructions: Flush IV access with Sodium Chloride  0.9% and Heparin  10units/ml or 100units/ml   Change dressing on IV access line: Weekly and PRN   Instructions Cath Flo 2mg : Administer for PICC Line  occlusion and as ordered by physician for other access device   [facility] pharmacist to adjust dose for: Vancomycin , Aminoglycosides and other anti-infective therapies as requested by physician   Increase activity slowly as tolerated   Complete by: As directed    Method of administration may be changed at the discretion of home infusion pharmacist based upon assessment of the patient and/or caregivers ability to self-administer the medication ordered   Complete by: As directed    Post-operative opioid taper instructions:   Complete by: As directed    POST-OPERATIVE OPIOID TAPER INSTRUCTIONS: It is important to wean off  of your opioid medication as soon as possible. If you do not need pain medication after your surgery it is ok to stop day one. Opioids include: Codeine, Hydrocodone (Norco, Vicodin), Oxycodone (Percocet, oxycontin ) and hydromorphone  amongst others.  Long term and even short term use of opiods can cause: Increased pain response Dependence Constipation Depression Respiratory depression And more.  Withdrawal symptoms can include Flu like symptoms Nausea, vomiting And more Techniques to manage these symptoms Hydrate well Eat regular healthy meals Stay active Use relaxation techniques(deep breathing, meditating, yoga) Do Not substitute Alcohol to help with tapering If you have been on opioids for less than two weeks and do not have pain than it is ok to stop all together.  Plan to wean off of opioids This plan should start within one week post op of your joint replacement. Maintain the same interval or time between taking each dose and first decrease the dose.  Cut the total daily intake of opioids by one tablet each day Next start to increase the time between doses. The last dose that should be eliminated is the evening dose.           Follow-up Information     [facility] [place] [place] (DME) [facility]ba [facility] Follow up.   Specialty: DME  Services Contact information: [address] [place]  [address] [phone]        [facility] - A Dept Of [place] Follow up on 08/03/2024.   Specialty: Infectious Diseases Why: 08/03/24 @ 9:45 am with Corean Fireman virtually to review path and assess abx tolerance - VIRTUAL APPT Contact information: [address] [place], [address] [place]  [address] [phone]        [facility] - A Dept Of [place] Follow up on 08/21/2024.   Specialty: Infectious Diseases Why: 08/21/24 @ 11:00 am with Dr. Overton for 4 week check Contact information: [address] [place], [address] [place]  [address] [phone]        [facility] Jerona GAILS, MD Follow up in 1 week(s).   Specialty: Orthopedic Surgery Contact information: [address]  [place] [address] [phone]                  Signed: [name] 07/26/2024, 8:29 AM

## 2024-07-26 NOTE — Discharge Summary (Signed)
 " Physician Discharge Summary  Patient ID: Brandon Flynn MRN: [id number] DOB/AGE: Jan 18, 1966 59 y.o.  Admit date: 07/20/2024 Discharge date: 07/26/2024  Admission Diagnoses:  Principal Problem:   Infected wound Active Problems:   Pseudomonas infection   Polymicrobial bacterial infection   Discharge Diagnoses:  Same  Past Medical History:  Diagnosis Date   Hypertension     Surgeries: Procedures: EXCISIONAL DEBRIDEMENT OF THE RIGHT ANKLE WOUND on 07/25/2024   Consultants:   Discharged Condition: Improved  Hospital Course: Brandon Flynn is an 59 y.o. male who was admitted 07/20/2024 with a chief complaint of No chief complaint on file. , and found to have a diagnosis of Infected wound.  They were brought to the operating room on 07/25/2024 and underwent the above named procedures.    They were given perioperative antibiotics:  Anti-infectives (From admission, onward)    Start     Dose/Rate Route Frequency Ordered Stop   07/26/24 0000  ceFEPime  (MAXIPIME ) IVPB        2 g Intravenous Every 8 hours 07/26/24 0829 08/22/24 2359   07/25/24 1319  tobramycin  (NEBCIN ) powder  Status:  Discontinued          As needed 07/25/24 1319 07/25/24 1328   07/24/24 0000  linezolid  (ZYVOX ) 600 MG tablet        600 mg Oral Every 12 hours 07/24/24 1249 08/03/24 2359   07/24/24 0000  metroNIDAZOLE  (FLAGYL ) 500 MG tablet        500 mg Oral Every 12 hours 07/24/24 1249 08/22/24 2359   07/23/24 2100  ceFEPIme  (MAXIPIME ) 2 g in sodium chloride  0.9 % 100 mL IVPB        2 g 200 mL/hr over 30 Minutes Intravenous Every 8 hours 07/23/24 1518     07/23/24 2100  metroNIDAZOLE  (FLAGYL ) tablet 500 mg        500 mg Oral Every 12 hours 07/23/24 1518     07/23/24 1630  linezolid  (ZYVOX ) tablet 600 mg        600 mg Oral Every 12 hours 07/23/24 1518     07/23/24 1400  piperacillin -tazobactam (ZOSYN ) IVPB 3.375 g  Status:  Discontinued        3.375 g 12.5 mL/hr over 240 Minutes Intravenous Every 8  hours 07/23/24 0646 07/23/24 1518   07/22/24 1400  meropenem  (MERREM ) 1 g in sodium chloride  0.9 % 100 mL IVPB  Status:  Discontinued        1 g 200 mL/hr over 30 Minutes Intravenous Every 8 hours 07/22/24 1121 07/23/24 0637   07/21/24 1800  piperacillin -tazobactam (ZOSYN ) IVPB 3.375 g  Status:  Discontinued        3.375 g 12.5 mL/hr over 240 Minutes Intravenous Every 8 hours 07/21/24 1458 07/22/24 1121   07/21/24 0915  meropenem  (MERREM ) 1 g in sodium chloride  0.9 % 100 mL IVPB  Status:  Discontinued        1 g 200 mL/hr over 30 Minutes Intravenous Every 8 hours 07/21/24 0828 07/21/24 1456   07/20/24 1008  vancomycin  (VANCOCIN ) powder  Status:  Discontinued          As needed 07/20/24 1032 07/20/24 1032   07/20/24 0715  ceFAZolin  (ANCEF ) IVPB 2g/100 mL premix        2 g 200 mL/hr over 30 Minutes Intravenous On call to O.R. 07/20/24 0701 07/20/24 1003     .  They were given compression stockings, early ambulation, and chemoprophylaxis for DVT prophylaxis.  They benefited  maximally from their hospital stay and there were no complications.    Recent vital signs:  Vitals:   07/26/24 0743 07/26/24 1000  BP: (!) 155/93 (!) 174/96  Pulse: 66 83  Resp: 18   Temp: 98.5 F (36.9 C)   SpO2: 97%     Recent laboratory studies:  Results for orders placed or performed during the hospital encounter of 07/20/24  CBC WITH DIFFERENTIAL   Collection Time: 07/20/24  7:02 AM  Result Value Ref Range   WBC 13.2 (H) 4.0 - 10.5 K/uL   RBC 4.61 4.22 - 5.81 MIL/uL   Hemoglobin 12.6 (L) 13.0 - 17.0 g/dL   HCT 62.5 (L) 60.9 - 47.9 %   MCV 81.1 80.0 - 100.0 fL   MCH 27.3 26.0 - 34.0 pg   MCHC 33.7 30.0 - 36.0 g/dL   RDW 85.3 88.4 - 84.4 %   Platelets 310 150 - 400 K/uL   nRBC 0.0 0.0 - 0.2 %   Neutrophils Relative % 68 %   Neutro Abs 8.8 (H) 1.7 - 7.7 K/uL   Lymphocytes Relative 25 %   Lymphs Abs 3.3 0.7 - 4.0 K/uL   Monocytes Relative 7 %   Monocytes Absolute 1.0 0.1 - 1.0 K/uL    Eosinophils Relative 0 %   Eosinophils Absolute 0.1 0.0 - 0.5 K/uL   Basophils Relative 0 %   Basophils Absolute 0.0 0.0 - 0.1 K/uL   Immature Granulocytes 0 %   Abs Immature Granulocytes 0.04 0.00 - 0.07 K/uL  Comprehensive metabolic panel   Collection Time: 07/20/24  7:02 AM  Result Value Ref Range   Sodium 137 135 - 145 mmol/L   Potassium 3.5 3.5 - 5.1 mmol/L   Chloride 103 98 - 111 mmol/L   CO2 22 22 - 32 mmol/L   Glucose, Bld 108 (H) 70 - 99 mg/dL   BUN 14 6 - 20 mg/dL   Creatinine, Ser 8.68 (H) 0.61 - 1.24 mg/dL   Calcium 9.5 8.9 - 89.6 mg/dL   Total Protein 7.4 6.5 - 8.1 g/dL   Albumin 4.2 3.5 - 5.0 g/dL   AST 23 15 - 41 U/L   ALT 31 0 - 44 U/L   Alkaline Phosphatase 53 38 - 126 U/L   Total Bilirubin 0.5 0.0 - 1.2 mg/dL   GFR, Estimated >39 >39 mL/min   Anion gap 12 5 - 15  No blood products   Collection Time: 07/20/24  7:52 AM  Result Value Ref Range   Transfuse no blood products      TRANSFUSE NO BLOOD PRODUCTS, VERIFIED BY DIAMOND EINSTEIN, RN Performed at [facility], [address] [place], [place] [address]   Aerobic/Anaerobic Culture w Gram Stain (surgical/deep wound)   Collection Time: 07/20/24 10:03 AM   Specimen: Soft Tissue, Other  Result Value Ref Range   Specimen Description TISSUE    Special Requests RT ANKLE    Gram Stain NO WBC SEEN ABUNDANT GRAM NEGATIVE RODS     Culture      ABUNDANT PSEUDOMONAS AERUGINOSA FEW STAPHYLOCOCCUS HAEMOLYTICUS NO ANAEROBES ISOLATED Performed at [facility], [address] [place], [place] [address]    Report Status 07/25/2024 FINAL    Organism ID, Bacteria PSEUDOMONAS AERUGINOSA    Organism ID, Bacteria STAPHYLOCOCCUS HAEMOLYTICUS       Susceptibility   Pseudomonas aeruginosa - MIC*    MEROPENEM  <=0.25 SENSITIVE Sensitive     CIPROFLOXACIN  2 RESISTANT Resistant  IMIPENEM 1 SENSITIVE Sensitive     PIP/TAZO Value in next row Sensitive      8 SENSITIVEThis is a modified FDA-approved test that  has been validated and its performance characteristics determined by the reporting laboratory.  This laboratory is certified under the Clinical Laboratory Improvement Amendments CLIA as qualified to perform high complexity clinical laboratory testing.    CEFTAZIDIME/AVIBACTAM Value in next row Sensitive      8 SENSITIVEThis is a modified FDA-approved test that has been validated and its performance characteristics determined by the reporting laboratory.  This laboratory is certified under the Clinical Laboratory Improvement Amendments CLIA as qualified to perform high complexity clinical laboratory testing.    CEFTOLOZANE/TAZOBACTAM Value in next row Sensitive      8 SENSITIVEThis is a modified FDA-approved test that has been validated and its performance characteristics determined by the reporting laboratory.  This laboratory is certified under the Clinical Laboratory Improvement Amendments CLIA as qualified to perform high complexity clinical laboratory testing.    TOBRAMYCIN  Value in next row Sensitive      8 SENSITIVEThis is a modified FDA-approved test that has been validated and its performance characteristics determined by the reporting laboratory.  This laboratory is certified under the Clinical Laboratory Improvement Amendments CLIA as qualified to perform high complexity clinical laboratory testing.    CEFTAZIDIME Value in next row Sensitive      8 SENSITIVEThis is a modified FDA-approved test that has been validated and its performance characteristics determined by the reporting laboratory.  This laboratory is certified under the Clinical Laboratory Improvement Amendments CLIA as qualified to perform high complexity clinical laboratory testing.    * ABUNDANT PSEUDOMONAS AERUGINOSA   Staphylococcus haemolyticus - MIC*    CIPROFLOXACIN  Value in next row Sensitive      8 SENSITIVEThis is a modified FDA-approved test that has been validated and its performance characteristics determined by the  reporting laboratory.  This laboratory is certified under the Clinical Laboratory Improvement Amendments CLIA as qualified to perform high complexity clinical laboratory testing.    ERYTHROMYCIN Value in next row Resistant      8 SENSITIVEThis is a modified FDA-approved test that has been validated and its performance characteristics determined by the reporting laboratory.  This laboratory is certified under the Clinical Laboratory Improvement Amendments CLIA as qualified to perform high complexity clinical laboratory testing.    GENTAMICIN Value in next row Sensitive      8 SENSITIVEThis is a modified FDA-approved test that has been validated and its performance characteristics determined by the reporting laboratory.  This laboratory is certified under the Clinical Laboratory Improvement Amendments CLIA as qualified to perform high complexity clinical laboratory testing.    OXACILLIN Value in next row Resistant      8 SENSITIVEThis is a modified FDA-approved test that has been validated and its performance characteristics determined by the reporting laboratory.  This laboratory is certified under the Clinical Laboratory Improvement Amendments CLIA as qualified to perform high complexity clinical laboratory testing.    TETRACYCLINE Value in next row Resistant      8 SENSITIVEThis is a modified FDA-approved test that has been validated and its performance characteristics determined by the reporting laboratory.  This laboratory is certified under the Clinical Laboratory Improvement Amendments CLIA as qualified to perform high complexity clinical laboratory testing.    VANCOMYCIN  Value in next row Sensitive      8 SENSITIVEThis is a modified FDA-approved test that has been validated and its performance characteristics determined  by the reporting laboratory.  This laboratory is certified under the Clinical Laboratory Improvement Amendments CLIA as qualified to perform high complexity clinical laboratory  testing.    TRIMETH/SULFA Value in next row Sensitive      8 SENSITIVEThis is a modified FDA-approved test that has been validated and its performance characteristics determined by the reporting laboratory.  This laboratory is certified under the Clinical Laboratory Improvement Amendments CLIA as qualified to perform high complexity clinical laboratory testing.    CLINDAMYCIN Value in next row Resistant      8 SENSITIVEThis is a modified FDA-approved test that has been validated and its performance characteristics determined by the reporting laboratory.  This laboratory is certified under the Clinical Laboratory Improvement Amendments CLIA as qualified to perform high complexity clinical laboratory testing.    RIFAMPIN Value in next row Sensitive      8 SENSITIVEThis is a modified FDA-approved test that has been validated and its performance characteristics determined by the reporting laboratory.  This laboratory is certified under the Clinical Laboratory Improvement Amendments CLIA as qualified to perform high complexity clinical laboratory testing.    Inducible Clindamycin Value in next row Resistant      8 SENSITIVEThis is a modified FDA-approved test that has been validated and its performance characteristics determined by the reporting laboratory.  This laboratory is certified under the Clinical Laboratory Improvement Amendments CLIA as qualified to perform high complexity clinical laboratory testing.    * FEW STAPHYLOCOCCUS HAEMOLYTICUS  CBC with Differential/Platelet   Collection Time: 07/24/24  4:35 AM  Result Value Ref Range   WBC 10.5 4.0 - 10.5 K/uL   RBC 4.00 (L) 4.22 - 5.81 MIL/uL   Hemoglobin 11.1 (L) 13.0 - 17.0 g/dL   HCT 66.8 (L) 60.9 - 47.9 %   MCV 82.8 80.0 - 100.0 fL   MCH 27.8 26.0 - 34.0 pg   MCHC 33.5 30.0 - 36.0 g/dL   RDW 85.5 88.4 - 84.4 %   Platelets 279 150 - 400 K/uL   nRBC 0.0 0.0 - 0.2 %   Neutrophils Relative % 59 %   Neutro Abs 6.1 1.7 - 7.7 K/uL    Lymphocytes Relative 32 %   Lymphs Abs 3.4 0.7 - 4.0 K/uL   Monocytes Relative 8 %   Monocytes Absolute 0.8 0.1 - 1.0 K/uL   Eosinophils Relative 1 %   Eosinophils Absolute 0.1 0.0 - 0.5 K/uL   Basophils Relative 0 %   Basophils Absolute 0.0 0.0 - 0.1 K/uL   Immature Granulocytes 0 %   Abs Immature Granulocytes 0.04 0.00 - 0.07 K/uL  Surgical pcr screen   Collection Time: 07/24/24  4:07 PM   Specimen: Nasal Mucosa; Nasal Swab  Result Value Ref Range   MRSA, PCR NEGATIVE NEGATIVE   Staphylococcus aureus NEGATIVE NEGATIVE    Discharge Medications:   Allergies as of 07/26/2024       Reactions   Sulfa Antibiotics Itching        Medication List     TAKE these medications    amLODipine  5 MG tablet Commonly known as: NORVASC  Take 5 mg by mouth daily.   ceFEPime  IVPB Commonly known as: MAXIPIME  Inject 2 g into the vein every 8 (eight) hours for 27 days. Indication:  Polymicrobial right ankle wound First Dose: Yes Last Day of Therapy:  08/22/2024 Labs - Once weekly:  CBC/D and BMP, Labs - Once weekly: ESR and CRP Method of administration: IV Push Method of administration may be  changed at the discretion of home infusion pharmacist based upon assessment of the patient and/or caregiver's ability to self-administer the medication ordered.   gabapentin  300 MG capsule Commonly known as: NEURONTIN  Take 1 capsule (300 mg total) by mouth 3 (three) times daily. 3 times a day when necessary neuropathy pain What changed:  when to take this additional instructions   HYDROcodone -acetaminophen  5-325 MG tablet Commonly known as: NORCO/VICODIN Take 1 tablet by mouth every 4 (four) hours as needed.   ibuprofen  400 MG tablet Commonly known as: ADVIL  Take 400 mg by mouth in the morning.   linezolid  600 MG tablet Commonly known as: ZYVOX  Take 1 tablet (600 mg total) by mouth every 12 (twelve) hours for 10 days. End of therapy is 08/03/2024   metroNIDAZOLE  500 MG tablet Commonly  known as: FLAGYL  Take 1 tablet (500 mg total) by mouth every 12 (twelve) hours. End of therapy is 08/22/2024   oxyCODONE  5 MG immediate release tablet Commonly known as: Oxy IR/ROXICODONE  Take 1 tablet (5 mg total) by mouth every 4 (four) hours as needed for severe pain (pain score 7-10).   valsartan 160 MG tablet Commonly known as: DIOVAN Take 160 mg by mouth daily.   vitamin C  1000 MG tablet Take 1,000 mg by mouth daily.               Discharge Care Instructions  (From admission, onward)           Start     Ordered   07/26/24 0000  Change dressing on IV access line weekly and PRN  (Home infusion instructions - [facility] )        07/26/24 0829            Diagnostic Studies: US  EKG SITE RITE Result Date: 07/23/2024 If Site Rite image not attached, placement could not be confirmed due to current cardiac rhythm.   Disposition: Discharge disposition: 01-Home or Self Care       Discharge Instructions     [facility] pharmacist to adjust dose for Vancomycin , Aminoglycosides and other anti-infective therapies as requested by physician.   Complete by: As directed    [facility] to provide Cath Flo 2mg    Complete by: As directed    Administer for PICC line occlusion and as ordered by physician for other access device issues.   Anaphylaxis Kit: Provided to treat any anaphylactic reaction to the medication being provided to the patient if First Dose or when requested by physician   Complete by: As directed    Epinephrine 1mg /ml vial / amp: Administer 0.3mg  (0.32ml) subcutaneously once for moderate to severe anaphylaxis, nurse to call physician and pharmacy when reaction occurs and call 911 if needed for immediate care   Diphenhydramine 50mg /ml IV vial: Administer 25-50mg  IV/IM PRN for first dose reaction, rash, itching, mild reaction, nurse to call physician and pharmacy when reaction occurs   Sodium Chloride  0.9% NS 500ml IV:  Administer if needed for hypovolemic blood pressure drop or as ordered by physician after call to physician with anaphylactic reaction   Call MD / Call 911   Complete by: As directed    If you experience chest pain or shortness of breath, CALL 911 and be transported to the hospital emergency room.  If you develope a fever above 101 F, pus (white drainage) or increased drainage or redness at the wound, or calf pain, call your surgeon's office.   Call MD / Call 911   Complete  by: As directed    If you experience chest pain or shortness of breath, CALL 911 and be transported to the hospital emergency room.  If you develope a fever above 101 F, pus (white drainage) or increased drainage or redness at the wound, or calf pain, call your surgeon's office.   Change dressing on IV access line weekly and PRN   Complete by: As directed    Constipation Prevention   Complete by: As directed    Drink plenty of fluids.  Prune juice may be helpful.  You may use a stool softener, such as Colace (over the counter) 100 mg twice a day.  Use MiraLax (over the counter) for constipation as needed.   Constipation Prevention   Complete by: As directed    Drink plenty of fluids.  Prune juice may be helpful.  You may use a stool softener, such as Colace (over the counter) 100 mg twice a day.  Use MiraLax (over the counter) for constipation as needed.   Discharge instructions   Complete by: As directed    Pre vena wound vac until f/u elevate your leg to decrease swelling and help with pain laying down. Walk as tolerates and alternate with elevation.   Flush IV access with Sodium Chloride  0.9% and Heparin  10 units/ml or 100 units/ml   Complete by: As directed    Home infusion instructions - [facility]   Complete by: As directed    Instructions: Flush IV access with Sodium Chloride  0.9% and Heparin  10units/ml or 100units/ml   Change dressing on IV access line: Weekly and PRN   Instructions Cath Flo 2mg :  Administer for PICC Line occlusion and as ordered by physician for other access device   [facility] pharmacist to adjust dose for: Vancomycin , Aminoglycosides and other anti-infective therapies as requested by physician   Increase activity slowly as tolerated   Complete by: As directed    Increase activity slowly as tolerated   Complete by: As directed    Method of administration may be changed at the discretion of home infusion pharmacist based upon assessment of the patient and/or caregivers ability to self-administer the medication ordered   Complete by: As directed    Post-operative opioid taper instructions:   Complete by: As directed    POST-OPERATIVE OPIOID TAPER INSTRUCTIONS: It is important to wean off of your opioid medication as soon as possible. If you do not need pain medication after your surgery it is ok to stop day one. Opioids include: Codeine, Hydrocodone (Norco, Vicodin), Oxycodone (Percocet, oxycontin ) and hydromorphone  amongst others.  Long term and even short term use of opiods can cause: Increased pain response Dependence Constipation Depression Respiratory depression And more.  Withdrawal symptoms can include Flu like symptoms Nausea, vomiting And more Techniques to manage these symptoms Hydrate well Eat regular healthy meals Stay active Use relaxation techniques(deep breathing, meditating, yoga) Do Not substitute Alcohol to help with tapering If you have been on opioids for less than two weeks and do not have pain than it is ok to stop all together.  Plan to wean off of opioids This plan should start within one week post op of your joint replacement. Maintain the same interval or time between taking each dose and first decrease the dose.  Cut the total daily intake of opioids by one tablet each day Next start to increase the time between doses. The last dose that should be eliminated is the evening dose.      Post-operative opioid  taper  instructions:   Complete by: As directed    POST-OPERATIVE OPIOID TAPER INSTRUCTIONS: It is important to wean off of your opioid medication as soon as possible. If you do not need pain medication after your surgery it is ok to stop day one. Opioids include: Codeine, Hydrocodone (Norco, Vicodin), Oxycodone (Percocet, oxycontin ) and hydromorphone  amongst others.  Long term and even short term use of opiods can cause: Increased pain response Dependence Constipation Depression Respiratory depression And more.  Withdrawal symptoms can include Flu like symptoms Nausea, vomiting And more Techniques to manage these symptoms Hydrate well Eat regular healthy meals Stay active Use relaxation techniques(deep breathing, meditating, yoga) Do Not substitute Alcohol to help with tapering If you have been on opioids for less than two weeks and do not have pain than it is ok to stop all together.  Plan to wean off of opioids This plan should start within one week post op of your joint replacement. Maintain the same interval or time between taking each dose and first decrease the dose.  Cut the total daily intake of opioids by one tablet each day Next start to increase the time between doses. The last dose that should be eliminated is the evening dose.           Follow-up Information     [facility] ([facility] Follow up.   Specialty: DME Services Contact information: [address] [place]  [address] [phone]        [facility] - [facility] Dept Of [facility] Follow up on 08/03/2024.   Specialty: Infectious Diseases Why: 08/03/24 @ 9:45 am with Corean Fireman virtually to review path and assess abx tolerance - VIRTUAL APPT Contact information: [address] [place], [address] [place]  [address] [phone]         [facility] - [facility] Dept Of [facility] Follow up on 08/21/2024.   Specialty: Infectious Diseases Why: 08/21/24 @ 11:00 am with Dr. Overton for 4 week check Contact information: [address] [place], [address] [place]  [address] [phone]        [facility] Jerona GAILS, MD Follow up in 1 week(s).   Specialty: Orthopedic Surgery Contact information: [address] [place]  [place] [address] [phone]                  Signed: Jerona GAILS Harden 07/26/2024, 10:45 AM  "

## 2024-07-26 NOTE — Progress Notes (Signed)
 Micro called this nurse to inform that they found gram rod on the culture. Notified Dr. Harden via secure chat. Pt is on antibiotics.

## 2024-07-26 NOTE — Progress Notes (Signed)
"   Belvie LABOR Fontaine to be D/C'd  per MD order.  Discussed with the patient and all questions fully answered.  VSS, Skin clean, dry and intact without evidence of skin break down, no evidence of skin tears noted.  IV catheter discontinued intact. Site without signs and symptoms of complications. Dressing and pressure applied.  An After Visit Summary was printed and given to the patient. Patient receive prescription for antibiotic and picking up medication from [facility] pharmacy.  D/c education completed with patient/family including follow up instructions, medication list, d/c activities limitations if indicated, with other d/c instructions as indicated by MD - patient able to verbalize understanding, all questions fully answered.   Patient instructed to return to ED, call 911, or call MD for any changes in condition.   Patient to be escorted via WC, and D/C home via private auto. "

## 2024-07-26 NOTE — Progress Notes (Signed)
 Patient ID: Brandon Flynn, male   DOB: 1966-02-14, 59 y.o.   MRN: [id number] Patient is postoperative day 1 repeat debridement lateral right ankle wound.  Cultures for AFB and tissue sent to pathology from surgery yesterday.  Will discharge on IV antibiotics and a Prevena plus portable wound VAC pump.  Follow-up in the office in 1 week.

## 2024-07-27 LAB — ACID FAST SMEAR (AFB, MYCOBACTERIA): Acid Fast Smear: NEGATIVE

## 2024-07-27 LAB — SURGICAL PATHOLOGY

## 2024-07-28 LAB — ACID FAST CULTURE WITH REFLEXED SENSITIVITIES (MYCOBACTERIA): Acid Fast Culture: NEGATIVE

## 2024-08-01 ENCOUNTER — Ambulatory Visit: Admitting: Family

## 2024-08-01 ENCOUNTER — Encounter: Payer: Self-pay | Admitting: Family

## 2024-08-01 DIAGNOSIS — S91001D Unspecified open wound, right ankle, subsequent encounter: Secondary | ICD-10-CM

## 2024-08-01 NOTE — Progress Notes (Signed)
 "  Post-Op Visit Note   Patient: Brandon Flynn           Date of Birth: 1966/02/05           MRN: [id number] Visit Date: 08/01/2024 PCP: [name], MD  Chief Complaint:  Chief Complaint  Patient presents with   Right Ankle - Routine Post Op    07/25/2024 excisional debridement right ankle     HPI:  HPI The patient is a 59 year old gentleman who is seen status post excisional debridement right ankle January 21 he currently has a PICC line in place he is receiving Flagyl  cefepime ; as well as linezolid  which he will finish this week  VAC removed today Ortho Exam On examination right posterior lateral ankle the wound has good uptake of the graft material graft material present in the wound bed there is no surrounding erythema or cellulitis no maceration or skin breakdown  Visit Diagnoses:  1. Wound of right ankle, subsequent encounter     Plan: Begin Vashe to dry dressing changes daily.  Patient aware of wound care instructions he will follow-up in the office in 1 week with Dr. Harden  Follow-Up Instructions: Return in about 1 week (around 08/08/2024).   Imaging: No results found.  Orders:  No orders of the defined types were placed in this encounter.  No orders of the defined types were placed in this encounter.    PMFS History: Patient Active Problem List   Diagnosis Date Noted   Pseudomonas infection 07/21/2024   Polymicrobial bacterial infection 07/21/2024   Infected wound 07/20/2024   Medication monitoring encounter 06/25/2024   Wound of right ankle 06/25/2024   Hypertension 06/25/2024   Ankle abrasion with infection, left, initial encounter 06/13/2024   Ankle wound, right, initial encounter 05/18/2024   Past Medical History:  Diagnosis Date   Hypertension     History reviewed. No pertinent family history.  Past Surgical History:  Procedure Laterality Date   ALLOGRAFT APPLICATION Right 07/20/2024   Procedure: APPLICATION, ALLOGRAFT, SKIN;   Surgeon: Harden Jerona GAILS, MD;  Location: [facility] OR;  Service: Orthopedics;  Laterality: Right;   APPLICATION OF WOUND VAC Right 06/13/2024   Procedure: WOUND VAC APPLICATION;  Surgeon: Harden Jerona GAILS, MD;  Location: [facility] OR;  Service: Orthopedics;  Laterality: Right;   APPLICATION OF WOUND VAC  06/15/2024   Procedure: APPLICATION, WOUND VAC;  Surgeon: Harden Jerona GAILS, MD;  Location: [facility] OR;  Service: Orthopedics;;   INCISION AND DRAINAGE OF DEEP ABSCESS, ANKLE Right 06/15/2024   Procedure: INCISION AND DRAINAGE OF DEEP ABSCESS, ANKLE;  Surgeon: Harden Jerona GAILS, MD;  Location: [facility] OR;  Service: Orthopedics;  Laterality: Right;  IRRIGATION AND DEBRIDEMENT OF RIGHT ANKLE   INCISION AND DRAINAGE OF DEEP ABSCESS, ANKLE Right 07/20/2024   Procedure: INCISION AND DRAINAGE OF DEEP ABSCESS, ANKLE;  Surgeon: Harden Jerona GAILS, MD;  Location: [facility] OR;  Service: Orthopedics;  Laterality: Right;  RIGHT ANKLE DEBRIDEMENT AND TISSUE GRAFT   INCISION AND DRAINAGE OF DEEP ABSCESS, ANKLE Right 07/25/2024   Procedure: EXCISIONAL DEBRIDEMENT OF THE RIGHT ANKLE WOUND;  Surgeon: Harden Jerona GAILS, MD;  Location: [facility] OR;  Service: Orthopedics;  Laterality: Right;  RIGHT ANKLE DEBRIDEMENT   INCISION AND DRAINAGE OF DEEP ABSCESS, CALF Right 05/18/2024   Procedure: INCISION AND DRAINAGE OF DEEP ABSCESS, CALF;  Surgeon: Harden Jerona GAILS, MD;  Location: [facility] OR;  Service: Orthopedics;  Laterality: Right;  RIGHT ANKLE DEBRIDEMENT AND TISSUE GRAFT   INCISION AND DRAINAGE  OF DEEP ABSCESS, CALF Right 06/13/2024   Procedure: RIGHT CALF INCISION AND DRAINAGE OF DEEP ABSCESS;  Surgeon: Harden Jerona GAILS, MD;  Location: [facility] OR;  Service: Orthopedics;  Laterality: Right;   MOUTH SURGERY     Social History   Occupational History   Not on file  Tobacco Use   Smoking status: Never   Smokeless tobacco: Never  Vaping Use   Vaping status: Never Used  Substance and Sexual Activity   Alcohol use: Yes    Alcohol/week: 5.0 standard drinks of alcohol    Types: 5  Standard drinks or equivalent per week   Drug use: Not Currently   Sexual activity: Not on file    "

## 2024-08-02 ENCOUNTER — Telehealth: Payer: Self-pay

## 2024-08-02 NOTE — Telephone Encounter (Signed)
 Mychart message sent about forms

## 2024-08-03 ENCOUNTER — Telehealth: Payer: Self-pay | Admitting: Infectious Diseases

## 2024-08-03 DIAGNOSIS — Z5181 Encounter for therapeutic drug level monitoring: Secondary | ICD-10-CM

## 2024-08-03 DIAGNOSIS — Z452 Encounter for adjustment and management of vascular access device: Secondary | ICD-10-CM

## 2024-08-03 DIAGNOSIS — L97319 Non-pressure chronic ulcer of right ankle with unspecified severity: Secondary | ICD-10-CM | POA: Diagnosis not present

## 2024-08-03 DIAGNOSIS — D72829 Elevated white blood cell count, unspecified: Secondary | ICD-10-CM | POA: Diagnosis not present

## 2024-08-03 DIAGNOSIS — B965 Pseudomonas (aeruginosa) (mallei) (pseudomallei) as the cause of diseases classified elsewhere: Secondary | ICD-10-CM | POA: Diagnosis not present

## 2024-08-03 DIAGNOSIS — S90561D Insect bite (nonvenomous), right ankle, subsequent encounter: Secondary | ICD-10-CM | POA: Diagnosis not present

## 2024-08-03 DIAGNOSIS — S91001D Unspecified open wound, right ankle, subsequent encounter: Secondary | ICD-10-CM

## 2024-08-03 DIAGNOSIS — A498 Other bacterial infections of unspecified site: Secondary | ICD-10-CM

## 2024-08-09 ENCOUNTER — Encounter: Payer: Self-pay | Admitting: Orthopedic Surgery

## 2024-08-09 ENCOUNTER — Ambulatory Visit: Admitting: Orthopedic Surgery

## 2024-08-09 DIAGNOSIS — S91001D Unspecified open wound, right ankle, subsequent encounter: Secondary | ICD-10-CM

## 2024-08-09 NOTE — Progress Notes (Signed)
 "  Office Visit Note   Patient: Brandon Flynn           Date of Birth: [birth year]           MRN: [id number] Visit Date: 08/09/2024              Requested by: [name], MD [address] [place],  [place] [address] PCP: [name], MD  Chief Complaint  Patient presents with   Right Ankle - Routine Post Op    07/25/2024 excisional debridement right ankle and multiple other debridements      HPI: Discussed the use of AI scribe software for clinical note transcription with the patient, who gave verbal consent to proceed.  History of Present Illness Brandon Flynn is a 59 year old male with a persistent right ankle wound who presents for follow-up of wound care and ongoing antibiotic therapy.  He is currently receiving intravenous cefepime  and oral metronidazole  for a right ankle wound infection, with antibiotics scheduled to continue for another fourteen days. He remains under the care of infectious disease, with a follow-up appointment scheduled. He has a history of infection with resistant organisms that did not resolve with initial therapy.  He utilizes probiotics, including Greek yogurt and supplements, to support gastrointestinal health during antibiotic therapy and is considering additional probiotic options.  He experiences intermittent discomfort at the wound site but has not noted any new or unusual symptoms since his last visit.     Assessment & Plan: Visit Diagnoses:  1. Wound of right ankle, subsequent encounter     Plan: Assessment and Plan Assessment & Plan Open wound of right ankle Right ankle wound healing with healthy granulation tissue, ongoing discomfort. Continued antibiotics due to prior resistant infection. - Applied meshed donated tissue graft to wound bed. - Applied Vaseline gauze over tissue graft. - Instructed to leave Vaseline gauze in place, change outer Vashe dressing, 4x4, and ace wrap as  needed. - Continued IV antibiotics and Flagyl  for 14 days. - Continued probiotics, including Greek yogurt. - Scheduled follow-up with infectious disease on February 17 for antibiotic discontinuation and PICC line removal.      Follow-Up Instructions: Return in about 1 week (around 08/16/2024).   Ortho Exam  Patient is alert, oriented, no adenopathy, well-dressed, normal affect, normal respiratory effort. Physical Exam SKIN: Wound bed with flat, healthy granulation tissue, measuring 6x10 cm.      Imaging: No results found.   Labs: Lab Results  Component Value Date   CRP 4.4 06/25/2024   REPTSTATUS 07/25/2024 FINAL 07/20/2024   GRAMSTAIN NO WBC SEEN ABUNDANT GRAM NEGATIVE RODS  07/20/2024   CULT  07/20/2024    ABUNDANT PSEUDOMONAS AERUGINOSA FEW STAPHYLOCOCCUS HAEMOLYTICUS NO ANAEROBES ISOLATED Performed at [facility], [address], [place], [place] [address]    LABORGA PSEUDOMONAS AERUGINOSA 07/20/2024   LABORGA STAPHYLOCOCCUS HAEMOLYTICUS 07/20/2024     Lab Results  Component Value Date   ALBUMIN 4.2 07/20/2024   ALBUMIN 4.2 06/13/2024    Lab Results  Component Value Date   MG 2.0 06/15/2024   No results found for: VD25OH  No results found for: PREALBUMIN    Latest Ref Rng & Units 07/24/2024    4:35 AM 07/20/2024    7:02 AM 06/25/2024    9:39 AM  CBC EXTENDED  WBC 4.0 - 10.5 K/uL 10.5  13.2  12.9   RBC 4.22 - 5.81 MIL/uL 4.00  4.61  4.72  Hemoglobin 13.0 - 17.0 g/dL 88.8  87.3  87.2   HCT 39.0 - 52.0 % 33.1  37.4  39.3   Platelets 150 - 400 K/uL 279  310  298   NEUT# 1.7 - 7.7 K/uL 6.1  8.8    Lymph# 0.7 - 4.0 K/uL 3.4  3.3       There is no height or weight on file to calculate BMI.  Orders:  No orders of the defined types were placed in this encounter.  No orders of the defined types were placed in this encounter.    Procedures: No procedures performed  Clinical Data: No additional findings.  ROS:  All other systems  negative, except as noted in the HPI. Review of Systems  Objective: Vital Signs: There were no vitals taken for this visit.  Specialty Comments:  No specialty comments available.  PMFS History: Patient Active Problem List   Diagnosis Date Noted   Pseudomonas infection 07/21/2024   Polymicrobial bacterial infection 07/21/2024   Infected wound 07/20/2024   Medication monitoring encounter 06/25/2024   Wound of right ankle 06/25/2024   Hypertension 06/25/2024   Ankle abrasion with infection, left, initial encounter 06/13/2024   Ankle wound, right, initial encounter 05/18/2024   Past Medical History:  Diagnosis Date   Hypertension     History reviewed. No pertinent family history.  Past Surgical History:  Procedure Laterality Date   ALLOGRAFT APPLICATION Right 07/20/2024   Procedure: APPLICATION, ALLOGRAFT, SKIN;  Surgeon: Harden Jerona GAILS, MD;  Location: [facility] OR;  Service: Orthopedics;  Laterality: Right;   APPLICATION OF WOUND VAC Right 06/13/2024   Procedure: WOUND VAC APPLICATION;  Surgeon: Harden Jerona GAILS, MD;  Location: [facility] OR;  Service: Orthopedics;  Laterality: Right;   APPLICATION OF WOUND VAC  06/15/2024   Procedure: APPLICATION, WOUND VAC;  Surgeon: Harden Jerona GAILS, MD;  Location: [facility] OR;  Service: Orthopedics;;   INCISION AND DRAINAGE OF DEEP ABSCESS, ANKLE Right 06/15/2024   Procedure: INCISION AND DRAINAGE OF DEEP ABSCESS, ANKLE;  Surgeon: Harden Jerona GAILS, MD;  Location: [facility] OR;  Service: Orthopedics;  Laterality: Right;  IRRIGATION AND DEBRIDEMENT OF RIGHT ANKLE   INCISION AND DRAINAGE OF DEEP ABSCESS, ANKLE Right 07/20/2024   Procedure: INCISION AND DRAINAGE OF DEEP ABSCESS, ANKLE;  Surgeon: Harden Jerona GAILS, MD;  Location: [facility] OR;  Service: Orthopedics;  Laterality: Right;  RIGHT ANKLE DEBRIDEMENT AND TISSUE GRAFT   INCISION AND DRAINAGE OF DEEP ABSCESS, ANKLE Right 07/25/2024   Procedure: EXCISIONAL DEBRIDEMENT OF THE RIGHT ANKLE WOUND;  Surgeon: Harden Jerona GAILS, MD;  Location: [facility]  OR;  Service: Orthopedics;  Laterality: Right;  RIGHT ANKLE DEBRIDEMENT   INCISION AND DRAINAGE OF DEEP ABSCESS, CALF Right 05/18/2024   Procedure: INCISION AND DRAINAGE OF DEEP ABSCESS, CALF;  Surgeon: Harden Jerona GAILS, MD;  Location: [facility] OR;  Service: Orthopedics;  Laterality: Right;  RIGHT ANKLE DEBRIDEMENT AND TISSUE GRAFT   INCISION AND DRAINAGE OF DEEP ABSCESS, CALF Right 06/13/2024   Procedure: RIGHT CALF INCISION AND DRAINAGE OF DEEP ABSCESS;  Surgeon: Harden Jerona GAILS, MD;  Location: [facility] OR;  Service: Orthopedics;  Laterality: Right;   MOUTH SURGERY     Social History   Occupational History   Not on file  Tobacco Use   Smoking status: Never   Smokeless tobacco: Never  Vaping Use   Vaping status: Never Used  Substance and Sexual Activity   Alcohol use: Yes    Alcohol/week: 5.0 standard drinks of alcohol  Types: 5 Standard drinks or equivalent per week   Drug use: Not Currently   Sexual activity: Not on file         "

## 2024-08-16 ENCOUNTER — Encounter: Admitting: Orthopedic Surgery

## 2024-08-21 ENCOUNTER — Ambulatory Visit: Payer: Self-pay | Admitting: Internal Medicine
